# Patient Record
Sex: Female | Born: 1935 | Hispanic: No | Smoking: Never smoker
Health system: Southern US, Community
[De-identification: ages and names within clinical notes are randomized; demographics above are authoritative.]

## PROBLEM LIST (undated history)

## (undated) DIAGNOSIS — G709 Myoneural disorder, unspecified: Secondary | ICD-10-CM

## (undated) DIAGNOSIS — E119 Type 2 diabetes mellitus without complications: Secondary | ICD-10-CM

## (undated) DIAGNOSIS — T7840XA Allergy, unspecified, initial encounter: Secondary | ICD-10-CM

## (undated) DIAGNOSIS — I10 Essential (primary) hypertension: Secondary | ICD-10-CM

## (undated) DIAGNOSIS — E785 Hyperlipidemia, unspecified: Secondary | ICD-10-CM

## (undated) DIAGNOSIS — F419 Anxiety disorder, unspecified: Secondary | ICD-10-CM

## (undated) DIAGNOSIS — M1990 Unspecified osteoarthritis, unspecified site: Secondary | ICD-10-CM

## (undated) DIAGNOSIS — R0989 Other specified symptoms and signs involving the circulatory and respiratory systems: Secondary | ICD-10-CM

## (undated) DIAGNOSIS — R079 Chest pain, unspecified: Secondary | ICD-10-CM

## (undated) HISTORY — DX: Hyperlipidemia, unspecified: E78.5

## (undated) HISTORY — DX: Myoneural disorder, unspecified: G70.9

## (undated) HISTORY — DX: Allergy, unspecified, initial encounter: T78.40XA

## (undated) HISTORY — DX: Essential (primary) hypertension: I10

## (undated) HISTORY — DX: Type 2 diabetes mellitus without complications: E11.9

## (undated) HISTORY — DX: Anxiety disorder, unspecified: F41.9

## (undated) HISTORY — DX: Unspecified osteoarthritis, unspecified site: M19.90

## (undated) HISTORY — PX: EYE SURGERY: SHX253

---

## 1996-07-27 HISTORY — PX: VEIN BYPASS SURGERY: SHX833

## 2009-07-27 HISTORY — PX: OTHER SURGICAL HISTORY: SHX169

## 2012-10-27 NOTE — Telephone Encounter (Signed)
L/m confirming appt for 11/01/12

## 2012-11-01 NOTE — Progress Notes (Signed)
Pt here for office visit, no new complaints at this time.

## 2012-11-01 NOTE — Progress Notes (Signed)
The patient is in office for follow-up visit.    This is a 79 year old female with history of coronary artery disease.  The patient was first evaluated in 1994 following a non-ST elevation myocardial infarction.   Workup at that time including catheterization  showed significant LAD disease and moderate disease of the left circumflex artery.  The LAD disease was treated with coronary stent.  However,  The patient presented again with unstable angina 2 months later.  Repeat catheterization show in-stent restenosis of the LAD and moderate left circumflex artery disease.  Subsequent, she was referred and underwent successful coronary artery bypass surgery.  Since then, the patient has been followed regularly in the office.  She remained stable and asymptomatic.  Echocardiogram from 2012 showed normal left ventricle systolic function with mild to moderate aortic regurgitation.    During her visit in the office in February of this year, she was noted to have a right carotid bruit.  Subsequent, carotid ultrasound done showed 60-80% right internal carotid stenosis. She was referred to Dr. Henrene Pastor for evaluation.  Apparently, no intervention was indicated and the patient is to be treated medically and observe.    In the office, she denies shortness of breath, chest discomfort, dyspnea on exertion, PND or orthopnea.   She also denied dizziness or TIA-like symptoms.    Physical examination showed heart rate 64 and regular, blood pressure 140/60.  Neck: Supple, no JVD, there is a soft right carotid bruit noted. Lungs: Clear to auscultation.  Heart: Normal S1, normal S2, 2/6 systolic ejection murmur at the left sternal border, no change from before. Abdomen: Soft, nontender, no abdominal bruit noted.  Extremities: No peripheral edema.    Impression: 1 coronary artery disease, status post coronary bypass surgery,  The patient is asymptomatic.  2  Right carotid  Artery stenosis, asymptomatic.3 diabetes.     Recommendation: 1 continue patient on her current medical therapy and clinical observation.    Follow-up in 3 months.

## 2012-11-01 NOTE — Patient Instructions (Signed)
MyChart Activation    Thank you for requesting access to MyChart. Please follow the instructions below to securely access and download your online medical record. MyChart allows you to send messages to your doctor, view your test results, renew your prescriptions, schedule appointments, and more.    How Do I Sign Up?    1. In your internet browser, go to [email or web address]  2. Click on the First Time User? Click Here link in the Sign In box. You will be redirect to the New Member Sign Up page.  3. Enter your MyChart Access Code exactly as it appears below. You will not need to use this code after you???ve completed the sign-up process. If you do not sign up before the expiration date, you must request a new code.    MyChart Access Code: [email or web address]  Expires: 01/30/2013  5:23 PM (This is the date your MyChart access code will expire)    4. Enter the last four digits of your Social Security Number (xxxx) and Date of Birth (mm/dd/yyyy) as indicated and click Submit. You will be taken to the next sign-up page.  5. Create a MyChart ID. This will be your MyChart login ID and cannot be changed, so think of one that is secure and easy to remember.  6. Create a MyChart password. You can change your password at any time.  7. Enter your Password Reset Question and Answer. This can be used at a later time if you forget your password.   8. Enter your e-mail address. You will receive e-mail notification when new information is available in MyChart.  9. Click Sign Up. You can now view and download portions of your medical record.  10. Click the Download Summary menu link to download a portable copy of your medical information.    Additional Information    If you have questions, please visit the Frequently Asked Questions section of the MyChart website at [email or web address] Remember, MyChart is NOT to be used for urgent needs. For medical emergencies, dial 911.

## 2013-01-26 NOTE — Telephone Encounter (Signed)
Confirmed 01/31/13 appt

## 2013-04-13 NOTE — Progress Notes (Addendum)
The patient is in office for follow-up visit.    Since her last visit, the patient denied chest pain, palpitation or dyspnea on exertion.  However, she does complain of occasional dizziness.    Physical examination showed heart rate 64 and regular, blood pressure 112/58.  Patient weights 120 pounds..  Patient is awake and oriented ??3, in no acute distress..  Neck: Supple, no JVD, soft right carotid bruit noted.  Lungs: Clear to auscultation.  Heart: Normal S1, normal S2, 2/6 systolic ejection murmur at the left sternal border. Abdomen: Soft, nontender, no abdominal bruit noted.  Extremities: No peripheral edema, full range of motion, steady gait.  Neurological: Grossly   intact.    Twelve-lead ECG showed normal sinus rhythm, nonspecific ST-T wave changes.    Review of laboratory examination dated July show total cholesterol 120, HDL 50, LDL 53 and triglycerides of 87.  Liver function tests was within normal limits.    Carotid ultrasound from Dr. Arlyss Gandy office in August showed 70-79% stenosis of the right internal carotid artery.    Impression: 1 coronary artery disease, status post coronary artery bypass surgery.  The patient is hemodynamically stable and asymptomatic.  2 carotid artery disease, asymptomatic.  3 borderline hypotension with dizziness, possibly due to medication.  4 diabetes.  5 hyperlipidemia, excellent lipid profile.    Recommendation: Decrease amlodipine to 2.5 mg daily, continue patient on her other medications.    Follow-up in one month.

## 2013-04-13 NOTE — Patient Instructions (Signed)
MyChart Activation    Thank you for requesting access to MyChart. Please follow the instructions below to securely access and download your online medical record. MyChart allows you to send messages to your doctor, view your test results, renew your prescriptions, schedule appointments, and more.    How Do I Sign Up?    1. In your internet browser, go to [email or web address]  2. Click on the First Time User? Click Here link in the Sign In box. You will be redirect to the New Member Sign Up page.  3. Enter your MyChart Access Code exactly as it appears below. You will not need to use this code after you???ve completed the sign-up process. If you do not sign up before the expiration date, you must request a new code.    MyChart Access Code: [email or web address]  Expires: 07/12/2013  9:21 AM (This is the date your MyChart access code will expire)    4. Enter the last four digits of your Social Security Number (xxxx) and Date of Birth (mm/dd/yyyy) as indicated and click Submit. You will be taken to the next sign-up page.  5. Create a MyChart ID. This will be your MyChart login ID and cannot be changed, so think of one that is secure and easy to remember.  6. Create a MyChart password. You can change your password at any time.  7. Enter your Password Reset Question and Answer. This can be used at a later time if you forget your password.   8. Enter your e-mail address. You will receive e-mail notification when new information is available in MyChart.  9. Click Sign Up. You can now view and download portions of your medical record.  10. Click the Download Summary menu link to download a portable copy of your medical information.    Additional Information    If you have questions, please visit the Frequently Asked Questions section of the MyChart website at [email or web address] Remember, MyChart is NOT to be used for urgent needs. For medical emergencies, dial 911.

## 2013-05-04 NOTE — Progress Notes (Signed)
The patient is in office for follow-up visit.    Since the patient's last visit, she states she has been feeling better.  According to her daughter, the patient seems to be a little more energetic.  She denied dizziness, palpitation, chest discomfort or shortness of breath.    Physical examination showed heart rate 61 and regular, blood pressure 124/68  Patient weights 118 pounds..  Patient is awake and oriented ??3, in no acute distress..  Neck: Supple, no JVD or bruit.  Lungs: Clear to auscultation.  Heart: Normal S1, normal S2, 2/6 systolic ejection murmur at the left sternal border.  Abdomen: Soft, nontender, no abdominal bruit noted.  Extremities: No peripheral edema, full range of motion, steady gait.  Neurological: Grossly intact.    Impression: 1 coronary artery disease, status post coronary bypass surgery.  The patient is asymptomatic.  2 carotid artery disease, asymptomatic.  3 borderline hypotension, blood pressure improved with decreased dose of amlodipine.  4 history of diabetes.  5 history of hyperlipidemia.    Recommendation:  Continue patient on her current medical therapy given the improved condition.    Follow-up in 3 months.     The patient was counseled on the dangers of tobacco use.     I attest that current meds have been reviewed and are accurate.

## 2013-09-07 NOTE — Patient Instructions (Signed)
MyChart Activation    Thank you for requesting access to MyChart. Please follow the instructions below to securely access and download your online medical record. MyChart allows you to send messages to your doctor, view your test results, renew your prescriptions, schedule appointments, and more.    How Do I Sign Up?    1. In your internet browser, go to [email or web address].  2. Click on the First Time User? Click Here link in the Sign In box. You will see the New Member Sign Up page.  3. Enter your MyChart Access Code exactly as it appears below. You will not need to use this code after you???ve completed the sign-up process. If you do not sign up before the expiration date, you must request a new code.    MyChart Access Code: [email or web address]  Expires: 12/06/2013  9:11 AM (This is the date your MyChart access code will expire)    4. Enter the last four digits of your Social Security Number (xxxx) and Date of Birth (mm/dd/yyyy) as indicated and click Submit. You will be taken to the next sign-up page.  5. Create a MyChart ID. This will be your MyChart login ID and cannot be changed, so think of one that is secure and easy to remember.  6. Create a MyChart password. You can change your password at any time.  7. Enter your Password Reset Question and Answer. This can be used at a later time if you forget your password.   8. Enter your e-mail address. You will receive e-mail notification when new information is available in MyChart.  9. Click Sign Up. You can now view and download portions of your medical record.  10. Click the Download Summary menu link to download a portable copy of your medical information.    Additional Information    If you have questions, please visit the Frequently Asked Questions section of the MyChart website at [email or web address] Remember, MyChart is NOT to be used for urgent needs. For medical emergencies, dial 911.

## 2013-09-07 NOTE — Progress Notes (Signed)
The patient is in office for follow-up visit.    Since the patient's last visit, she denied chest discomfort, shortness of breath or dyspnea on exertion.  She also stated dizziness has improved after decrease of the medication.    Physical examination showed heart rate 72 and regular, blood pressure 122/50.  Patient weights 120 pounds  Patient is awake and oriented ??3, in no acute distress..  Neck: Supple, no JVD, soft right carotid bruit.  Lungs: Clear to auscultation.  Heart: Normal S1, normal S2, 2/6 systolic ejection murmur at left sternal border.  Abdomen: Soft, nontender, no abdominal bruit noted.  Extremities: No peripheral edema, full range of motion, steady gait.  Neurological: Grossly intact.    Impression: 1 coronary artery disease, status post coronary bypass surgery.  The patient is angina free.  2 carotid artery disease, 70-79% by ultrasound, the patient is asymptomatic.  3 history of hypertension, blood pressure control.  For diabetes.  5 history of hyperlipidemia.    Recommendation: Continue patient on her current medical therapy.    Follow-up in 3 months.    Discussed the patient's above normal BMI with the patient.  I have recommended the following interventions: dietary management education, guidance, and counseling  encourage exercise .      The patient was counseled on the dangers of tobacco use.     I attest that current meds have been reviewed and are accurate.

## 2013-12-07 NOTE — Patient Instructions (Signed)
MyChart Activation    Thank you for requesting access to MyChart. Please follow the instructions below to securely access and download your online medical record. MyChart allows you to send messages to your doctor, view your test results, renew your prescriptions, schedule appointments, and more.    How Do I Sign Up?    1. In your internet browser, go to [email or web address].  2. Click on the First Time User? Click Here link in the Sign In box. You will see the New Member Sign Up page.  3. Enter your MyChart Access Code exactly as it appears below. You will not need to use this code after you???ve completed the sign-up process. If you do not sign up before the expiration date, you must request a new code.    MyChart Access Code: [email or web address]  Expires: 03/07/2014  9:20 AM (This is the date your MyChart access code will expire)    4. Enter the last four digits of your Social Security Number (xxxx) and Date of Birth (mm/dd/yyyy) as indicated and click Submit. You will be taken to the next sign-up page.  5. Create a MyChart ID. This will be your MyChart login ID and cannot be changed, so think of one that is secure and easy to remember.  6. Create a MyChart password. You can change your password at any time.  7. Enter your Password Reset Question and Answer. This can be used at a later time if you forget your password.   8. Enter your e-mail address. You will receive e-mail notification when new information is available in MyChart.  9. Click Sign Up. You can now view and download portions of your medical record.  10. Click the Download Summary menu link to download a portable copy of your medical information.    Additional Information    If you have questions, please visit the Frequently Asked Questions section of the MyChart website at [email or web address] Remember, MyChart is NOT to be used for urgent needs. For medical emergencies, dial 911.

## 2013-12-07 NOTE — Progress Notes (Signed)
The patient is in office for follow-up visit.    Since her last visit, the patient has been feeling well.  She denied chest discomfort, shortness of breath, dyspnea on exertion, PND or orthopnea.    Physical examination showed heart rate 68 and regular, blood pressure 150/72.  Patient weights 119 pounds..  Patient is awake and oriented ??3, in no acute distress..  Neck: Supple, no JVD, right carotid bruit.  Lungs: Clear to auscultation.  Heart: Normal S1, normal S2, 2/6 systolic ejection murmur at the left sternal border. Abdomen: Soft, nontender, no abdominal bruit noted.  Extremities: No peripheral edema, full range of motion, steady gait.  Neurological: Grossly intact.    Twelve-lead ECG showed normal sinus rhythm, within normal limits.    Impression: 1 coronary disease, status post coronary bypass surgery.  The patient is stable.  2 carotid artery disease, asymptomatic.  3 hypertension.  4 diabetes.  5 hyperlipidemia.    Recommendation: Continue patient on medical therapy.    Follow-up in 3 months.      Discussed the patient's above normal BMI with the patient.  I have recommended the following interventions: dietary management education, guidance, and counseling  encourage exercise .      The patient was counseled on the dangers of tobacco use.     I attest that current meds have been reviewed and are accurate.

## 2014-05-22 ENCOUNTER — Ambulatory Visit
Admit: 2014-05-22 | Discharge: 2014-05-22 | Payer: MEDICARE | Attending: Interventional Cardiology | Primary: Internal Medicine

## 2014-05-22 DIAGNOSIS — I2510 Atherosclerotic heart disease of native coronary artery without angina pectoris: Secondary | ICD-10-CM

## 2014-05-22 NOTE — Progress Notes (Signed)
The patient is in office for follow-up visit.    Since the patient's last visit, she states she has experienced a few episodes of chest discomfort.  She she described one particular episode as crushing chest discomfort, nonradiating, not associated shows of breath with diaphoresis.  The discomfort is not related.  The other episodes were described as pinching sensation. The patient [place].  The EKG showed no significant changes. She stated that she increase the Imdur from 30 mg to 45 mg daily.    Physical examination showed heart rate 72 and regular, blood pressure 140/70.  Patient weighs 120 pounds..  Patient is awake and oriented ??3, in no acute distress..  Neck: Supple, no JVD, right carotid bruit.Lungs: Clear to auscultation.  Heart: Normal S1, normal S2, 2/6 systolic ejection murmur at left sternal border. Abdomen: Soft, nontender, no abdominal bruit noted.  Extremities: No peripheral edema, full range of motion, steady gait.  Neurological: Grossly intact.    Twelve-lead ECG showed normal sinus rhythm, nonspecific ST-T wave changes.    Impression: 1 coronary artery disease, status post coronary artery bypass surgery.  2 chest pain of undetermined etiology, rule out angina.  3 hypertension.  4 diabetes.  5 hyperlipidemia.  6 carotid disease, asymptomatic.    Recommendation: Outpatient pharmacology stress Myoview test to rule out graft closure.  The patient is to continue her current medications.    Discussed the patient's above normal BMI with the patient.  I have recommended the following interventions: dietary management education, guidance, and counseling  encourage exercise .      The patient was counseled on the dangers of tobacco use.     I attest that current meds have been reviewed and are accurate.

## 2014-05-23 NOTE — Telephone Encounter (Addendum)
patients daughter called today and said that they would like to proceed with the cath . No stress test. I will set up cath for 06/06/2014 .cs    Discussed with pt daughter- agree with stress test, will proceed  A.Kizzie IdeShih

## 2014-05-24 ENCOUNTER — Encounter

## 2014-05-30 ENCOUNTER — Inpatient Hospital Stay: Admit: 2014-05-30 | Payer: MEDICARE | Attending: Interventional Cardiology | Primary: Internal Medicine

## 2014-05-30 DIAGNOSIS — R0789 Other chest pain: Secondary | ICD-10-CM

## 2014-05-30 DIAGNOSIS — R079 Chest pain, unspecified: Secondary | ICD-10-CM

## 2014-05-30 MED ORDER — TECHNETIUM TC 99M SESTAMIBI - CARDIOLITE
Freq: Once | Status: AC
Start: 2014-05-30 — End: 2014-05-30
  Administered 2014-05-30: 16:00:00 via INTRAVENOUS

## 2014-05-30 MED ORDER — REGADENOSON 0.4 MG/5 ML IV SYRINGE
0.4 mg/5 mL | INTRAVENOUS | Status: AC
Start: 2014-05-30 — End: 2014-05-30
  Administered 2014-05-30: 16:00:00 via INTRAVENOUS

## 2014-05-30 MED ORDER — TECHNETIUM TC 99M SESTAMIBI - CARDIOLITE
Freq: Once | Status: AC
Start: 2014-05-30 — End: 2014-05-30
  Administered 2014-05-30: 17:00:00 via INTRAVENOUS

## 2014-05-30 MED FILL — LEXISCAN 0.4 MG/5 ML INTRAVENOUS SYRINGE: 0.4 mg/5 mL | INTRAVENOUS | Qty: 5

## 2014-05-30 MED FILL — TECHNETIUM TC 99M SESTAMIBI - CARDIOLITE: Qty: 11

## 2014-05-30 MED FILL — TECHNETIUM TC 99M SESTAMIBI - CARDIOLITE: Qty: 31

## 2014-05-30 NOTE — Procedures (Signed)
[facility]  STRESS TEST                                            Name: Anna Hardy, Anna Hardy                                            MR#: [id number]                                            Account #: [id number]                                            Date of Adm: 05/30/2014        Facility [facility]  DocId [id number]  ChartScript-[id number]    ADENOSINE MYOVIEW REPORT    HISTORY: A 80-year-old female with history of diabetes,  coronary artery disease, status post coronary artery bypass surgery  in the remote past, is now referred for a stress test which was done  for recurrent chest discomfort.    PHYSICAL EXAMINATION  VITAL SIGNS: Heart rate 83 and regular, blood pressure 185/66.  CARDIOVASCULAR: Exam was unremarkable.    EKG: A 12-lead EKG showed normal sinus rhythm, within normal  limits.    STRESS TEST: The patient underwent adenosine Myoview as per  protocol. There was no chest discomfort or shortness of breath  reported with adenosine. A 12-lead EKG with adenosine showed  0.5 to 1 mm downsloping ST depression in inferolateral leads. The  test is interpreted as positive stress test by EKG criteria. Nuclear  imaging pending.    Further recommendations pending results of nuclear imaging.        Jaleena Viviani CHIH MD    AS / CR  D:  05/30/2014   11:38  T:  05/30/2014   12:33  Job #:  [id number]              wm    CS Doc#:  [id number]                                    Page 1 of 2

## 2014-05-30 NOTE — Progress Notes (Signed)
Pt tolerated procedure well; NAD noted; VS as charted; pt without complaints; Dr Kizzie IdeShih in attendance; pt awaiting further scans with family, in stable condition.

## 2014-05-30 NOTE — Procedures (Signed)
[facility]                                            Name: Anna Hardy, Anna Hardy                                            MR#: [id number]                                            Account #: [id number]                                            Date of Adm: 05/30/2014        Facility [facility]  DocId [id number]  ChartScript-[id number]    ADENOSINE MYOVIEW REPORT    HISTORY: A 80 year old female with history of diabetes,  coronary artery disease, status post coronary artery bypass surgery  in the remote past, is now referred for a stress test which was done  for recurrent chest discomfort.    PHYSICAL EXAMINATION  VITAL SIGNS: Heart rate 83 and regular, blood pressure 185/66.  CARDIOVASCULAR: Exam was unremarkable.    EKG: A 12-lead EKG showed normal sinus rhythm, within normal  limits.    STRESS TEST: The patient underwent adenosine Myoview as per  protocol. There was no chest discomfort or shortness of breath  reported with adenosine. A 12-lead EKG with adenosine showed  0.5 to 1 mm downsloping ST depression in inferolateral leads. The  test is interpreted as positive stress test by EKG criteria. Nuclear  imaging pending.    Further recommendations pending results of nuclear imaging.        Ulyess MortSHIH, Shivank Pinedo [facility] MD    AS / [place]  D:  05/30/2014   11:38  T:  05/30/2014   12:33  Job #:  [id number]              wm    CS Doc#:  [id number]                                    Page 1 of 2

## 2014-07-12 ENCOUNTER — Encounter: Payer: MEDICARE | Attending: Interventional Cardiology | Primary: Internal Medicine

## 2014-07-12 ENCOUNTER — Encounter: Attending: Interventional Cardiology | Primary: Internal Medicine

## 2014-07-17 ENCOUNTER — Ambulatory Visit
Admit: 2014-07-17 | Discharge: 2014-07-17 | Payer: MEDICARE | Attending: Interventional Cardiology | Primary: Internal Medicine

## 2014-07-17 DIAGNOSIS — I2510 Atherosclerotic heart disease of native coronary artery without angina pectoris: Secondary | ICD-10-CM

## 2014-07-17 MED ORDER — VALSARTAN-HYDROCHLOROTHIAZIDE 320 MG-25 MG TAB
320-25 mg | ORAL_TABLET | Freq: Every day | ORAL | Status: DC
Start: 2014-07-17 — End: 2015-01-10

## 2014-07-17 NOTE — Progress Notes (Signed)
The patient is in office for follow-up visit.    Since the patient's last visit, she underwent an outpatient stress Myoview test.  The Myoview scan showed no evidence of ischemia with ejection fraction of 75%.  In addition, the patient was evaluated in urgent care center for chest discomfort.  She was noted her blood pressure was elevated.    In the office, the patient denied recurrent chest discomfort, shortness of breath or dyspnea on exertion.    Physical examination showed heart rate 68 and regular, blood pressure 158/64 initially with repeat 140/60.  Patient weights 121 pounds.  Patient is awake and oriented ??3, in no acute distress..  Neck: Supple, no JVD, right carotid bruit.  Lungs: Clear to auscultation.  Heart: Normal S1, normal S2, 2/6 systolic ejection murmur at the left sternal border..  Abdomen: Soft, nontender, no abdominal bruit noted.  Extremities: No peripheral edema, full range of motion, steady gait.  Neurological: Grossly intact.    Impression: 1 coronary artery disease, status post coronary bypass surgery.  2 chest pain, no recurrence, negative stress Myoview test recently.  3 diabetes.  4 hypertension.  5 hyperlipidemia.  6 right carotid bruit.    Recommendation: Change Diovan HCT from 320/12.5 mg daily to 320/25 mg daily for better blood pressure control.  The patient is to continue on her other medications.    Follow-up in one month.      Discussed the patient's above normal BMI with the patient.  I have recommended the following interventions: dietary management education, guidance, and counseling  encourage exercise .      The patient was counseled on the dangers of tobacco use.     I attest that current meds have been reviewed and are accurate.

## 2014-08-16 ENCOUNTER — Ambulatory Visit
Admit: 2014-08-16 | Discharge: 2014-08-16 | Payer: MEDICARE | Attending: Interventional Cardiology | Primary: Internal Medicine

## 2014-08-16 DIAGNOSIS — I2510 Atherosclerotic heart disease of native coronary artery without angina pectoris: Secondary | ICD-10-CM

## 2014-08-16 NOTE — Progress Notes (Signed)
The patient is in office for follow-up visit.    Since the patient's last visit, she has been on Diovan HCT 320/25 mg daily.  She denies dizziness, shortness of breath, chest discomfort or syncope.    Physical examination showed heart rate 54 and regular, blood pressure 152/58 initially with repeat 144/60.  Patient weighs 121 pounds which is unchanged from before..  Patient is awake and oriented ??3, in no acute distress..  Neck: Supple, no JVD, right carotid bruit  Lungs: Clear to auscultation.  Heart: Normal S1, normal S2, 2/6 systolic ejection murmur at left sternal border..  Abdomen: Soft, nontender, no abdominal bruit noted.  Extremities: No peripheral edema, full range of motion, steady gait.  Neurological: Grossly intact.    Impression: 1 coronary artery disease, status post coronary artery bypass surgery the patient is asymptomatic.  2  Diabetes.  3 hypertension.  4 hyperlipidemia.  5 right carotid bruit.    Recommendation: Continue patient on her current medical therapy.    Follow-up in 3 months.    Discussed the patient's above normal BMI with the patient.  I have recommended the following interventions: dietary management education, guidance, and counseling  encourage exercise .      The patient was counseled on the dangers of tobacco use.     I attest that current meds have been reviewed and are accurate.

## 2014-11-13 ENCOUNTER — Ambulatory Visit
Admit: 2014-11-13 | Discharge: 2014-11-13 | Payer: MEDICARE | Attending: Interventional Cardiology | Primary: Internal Medicine

## 2014-11-13 DIAGNOSIS — I2510 Atherosclerotic heart disease of native coronary artery without angina pectoris: Secondary | ICD-10-CM

## 2014-11-13 NOTE — Progress Notes (Signed)
The patient is in office for follow-up visit.    Since the patient's last visit, she had developed symptom of jaw discomfort with brisk walk.  She denied shows of breath or diaphoresis with  jaw discomfort. The discomfort would resolve at rest.  She denied chest discomfort or jaw pain at rest.    Physical examination showed heart rate 70 and regular, blood pressure 130/68 with repeat 140/60.  Patient weights 122 pounds..  Patient is awake and oriented ??3, in no acute distress..  Neck: Supple, no JVD, bilateral carotidbruit.  Lungs: Clear to auscultation.  Heart: Normal S1, normal S2, 2/6 systolic ejection murmur at left sternal border.  Abdomen: Soft, nontender, no abdominal bruit noted.  Extremities: No peripheral edema, full range of motion, steady gait.  Neurological: Grossly intact.    Twelve-lead ECG showed normal sinus rhythm, within normal limits.    Impression: 1 coronary artery disease, status post coronary artery bypass surgery in the past.  2.  Recent onset jaw discomfort with physical activity, possible exertional angina.  The patient had a negative stress nuclear study in November 2015.  3.  Diabetes.  4.  Carotid artery disease.  5.  Hyperlipidemia.  6.  Hypertension.    Recommendation:  Increase isosorbide mononitrate to 60 mg daily.  The patient should continue her other medications.  echocardiogram to assess LV function and valvular pathology.    Follow-up in one month.    Discussed the patient's above normal BMI with the patient.  I have recommended the following interventions: dietary management education, guidance, and counseling  encourage exercise .      The patient was counseled on the dangers of tobacco use.     I attest that current meds have been reviewed and are accurate.

## 2014-12-05 ENCOUNTER — Encounter: Payer: MEDICARE | Primary: Internal Medicine

## 2014-12-10 ENCOUNTER — Ambulatory Visit
Admit: 2014-12-10 | Discharge: 2014-12-10 | Payer: MEDICARE | Attending: Interventional Cardiology | Primary: Internal Medicine

## 2014-12-10 DIAGNOSIS — I2510 Atherosclerotic heart disease of native coronary artery without angina pectoris: Secondary | ICD-10-CM

## 2014-12-10 NOTE — Progress Notes (Signed)
The patient is in office for follow-up visit.    Since the patient's last visit, she had an outpatient echocardiogram.  The echo showed normal left systolic function with mild aortic and mitral regurgitation.    In the office, the patient states she feels better.  She denied recurrent jaw pain with activities.  She is only taking isosorbide mononitrate 45 mg daily.    Physical examination showed heart rate 82 and regular, blood pressure 118/50.  Patient weights 124 pounds..  Patient is awake and oriented ??3, in no acute distress..  Neck: Supple, no JVD, bilateral carotid bruit  Lungs: Clear to auscultation.  Heart: Normal S1, normal S2, 2/6 systolic ejection murmur at upper left sternal border.  Abdomen: Soft, nontender, no abdominal bruit noted.  Extremities: No peripheral edema, full range of motion, steady gait.  Neurological: Grossly intact.    Impression: 1 coronary artery disease, status post coronary artery bypass surgery.  Negative stress Myoview test in November 2015.  2.  Chest discomfort, possible exertional angina.  Symptom improved with higher dose of oral nitrate.  3.  Diabetes.  4.  History of carotid artery disease.  5.  Hyperlipidemia.  6.  Hypertension.    Recommendation: Continue patient on her current medical therapy. Carotid ultrasound.    I discussed the result of the echocardiogram with the patient and her daughter.    Follow-up in 3 months.      Discussed the patient's above normal BMI with the patient.  I have recommended the following interventions: dietary management education, guidance, and counseling  encourage exercise .      The patient was counseled on the dangers of tobacco use.     I attest that current meds have been reviewed and are accurate.

## 2014-12-11 ENCOUNTER — Encounter: Attending: Interventional Cardiology | Primary: Internal Medicine

## 2015-01-10 MED ORDER — VALSARTAN-HYDROCHLOROTHIAZIDE 320 MG-25 MG TAB
320-25 mg | ORAL_TABLET | ORAL | Status: DC
Start: 2015-01-10 — End: 2017-03-11

## 2015-01-16 ENCOUNTER — Encounter: Primary: Internal Medicine

## 2015-02-06 ENCOUNTER — Encounter: Payer: MEDICARE | Primary: Internal Medicine

## 2015-03-11 ENCOUNTER — Encounter: Attending: Interventional Cardiology | Primary: Internal Medicine

## 2015-03-18 ENCOUNTER — Ambulatory Visit
Admit: 2015-03-18 | Discharge: 2015-03-18 | Payer: MEDICARE | Attending: Interventional Cardiology | Primary: Internal Medicine

## 2015-03-18 DIAGNOSIS — I2510 Atherosclerotic heart disease of native coronary artery without angina pectoris: Secondary | ICD-10-CM

## 2015-03-18 NOTE — Progress Notes (Signed)
The patient is in office for follow-up visit.    Since her last visit, the patient had a carotid ultrasound.  The ultrasound showed moderate disease of common carotid arteries bilaterally approximately 50-69%.    In the office, the patient denied palpitation, dizziness, shortness of breath or chest discomfort.  She is active physically and is compliant with her medications.    Physical examination showed heart rate 60 and regular, blood pressure 148/66 with repeat 140/60.  Patient weights 125 pounds.  Patient is awake and oriented ??3, in no acute distress..  Neck: Supple, no JVD, bilateral carotid bruit.  Lungs: Clear to auscultation.  Heart: Normal S1, normal S2, 2/6 systolic ejection murmur at the left sternal border..  Abdomen: Soft, nontender, no abdominal bruit noted.  Extremities: No peripheral edema, full range of motion, steady gait.  Neurological: Grossly intact.    Impression: 1 coronary disease, status post coronary bypass surgery.  The patient is stable.  2 bilateral carotid artery disease, moderate by ultrasound.  3 diabetes.  4 history of hypertension.  5 history of hyperlipidemia.    Recommendation: Continue patient on her current medical therapy.    I discussed the result of the carotid ultrasound with the patient and her daughter.    Follow-up in 3 months.      Discussed the patient's above normal BMI with the patient.  I have recommended the following interventions: dietary management education, guidance, and counseling  encourage exercise .      The patient was counseled on the dangers of tobacco use.     I attest that current meds have been reviewed and are accurate.

## 2015-06-10 ENCOUNTER — Ambulatory Visit
Admit: 2015-06-10 | Discharge: 2015-06-10 | Payer: MEDICARE | Attending: Interventional Cardiology | Primary: Internal Medicine

## 2015-06-10 DIAGNOSIS — I2510 Atherosclerotic heart disease of native coronary artery without angina pectoris: Secondary | ICD-10-CM

## 2015-06-10 NOTE — Progress Notes (Signed)
The patient is in office for follow-up visit.    Since her last visit, the patient has done well.  She denied chest discomfort, shortness of breath, dyspnea on exertion, PND or orthopnea.  She also denied palpitation or dizziness.    Physical examination showed heart rate 73 regular, blood pressure 130/78.  Patient weighs 124 pounds.  Patient is awake and oriented ??3, in no acute distress..  Neck: Supple, no JVD, soft bilateral carotidbruit.  Lungs: Clear to auscultation.  Heart: Normal S1, normal S2, 2/6 systolic ejection murmur at left sternal border..  Abdomen: Soft, nontender, no abdominal bruit noted.  Extremities: No peripheral edema, full range of motion, steady gait.  Neurological: Grossly intact.    Twelve-lead ECG showed normal sinus rhythm, within normal limits.    Impression: 1 coronary artery disease, status post coronary bypass surgery.  Patient has remained stable and asymptomatic.  2 bilateral carotid disease, asymptomatic.  3 diabetes.  4 hypertension.  5 hyperlipidemia.    Recommendation: Continue patient on current  medications.    Follow-up in 3 months.        Discussed the patient's above normal BMI with the patient.  I have recommended the following interventions: dietary management education, guidance, and counseling  encourage exercise .      The patient was counseled on the dangers of tobacco use.     I attest that current meds have been reviewed and are accurate.

## 2015-09-09 ENCOUNTER — Ambulatory Visit
Admit: 2015-09-09 | Discharge: 2015-09-09 | Payer: MEDICARE | Attending: Interventional Cardiology | Primary: Internal Medicine

## 2015-09-09 DIAGNOSIS — I2510 Atherosclerotic heart disease of native coronary artery without angina pectoris: Secondary | ICD-10-CM

## 2015-09-09 NOTE — Progress Notes (Signed)
The patient is in office for follow-up visit.    Since the patient's last visit, she has been doing well.  She denied chest discomfort, shortness of breath, dyspnea on exertion, PND or orthopnea.  The patient is compliant with her medications.    Physical examination showed heart rate 72 and regular, blood pressure 132/62.  Patient weight 125 pounds..  Patient is awake and oriented ??3, in no acute distress..  Neck: Supple, no JVD bilateral carotid bruit.,.  Lungs: Clear to auscultation.  Heart: Normal S1, normal S2, 2/6 systolic ejection murmur at the left sternal border. Abdomen: Soft, nontender, no abdominal bruit noted.  Extremities: No peripheral edema, full range of motion, steady gait.  Neurological: Grossly intact.    Impression: 1 coronary artery disease, status post coronary bypass surgery.  2 bilateral carotid artery disease, asymptomatic.  3 diabetes.  4 hyperlipidemia.  5 hypertension.    Recommendation: Continue patient on her current medications.    Follow-up in 3 months.    Discussed the patient's above normal BMI with the patient.  I have recommended the following interventions: dietary management education, guidance, and counseling  encourage exercise .      The patient was counseled on the dangers of tobacco use.     I attest that current meds have been reviewed and are accurate.

## 2016-01-02 ENCOUNTER — Ambulatory Visit
Admit: 2016-01-02 | Discharge: 2016-01-02 | Payer: MEDICARE | Attending: Interventional Cardiology | Primary: Internal Medicine

## 2016-01-02 DIAGNOSIS — I2510 Atherosclerotic heart disease of native coronary artery without angina pectoris: Secondary | ICD-10-CM

## 2016-01-02 NOTE — Progress Notes (Signed)
The patient is in office for unscheduled visit because of hypotension and for history of coronary disease, hyperlipidemia and diabetes.    The patient stated that he was doing well until yesterday when she experienced an episode of headache.  Apparently, blood pressure was noted to be 80/40 by her family at that time.  She denied dizziness, syncope, diaphoresis or chest discomfort.  This happened shortly after she took isosorbide mononitrate 45 mg.  She ate high salt food afterwards with improvement of her symptoms.  On questioning, she denied recent symptom of chest discomfort, shortness of breath, dyspnea on exertion, PND or orthopnea.  She has remained physically active.    Physical examination showed heart rate 75 and regular, blood pressure 130/58.  Patient was 126 pounds..  Patient is awake and oriented ??3, in no acute distress..  Neck: Supple, no JVD, bilateral carotid bruit.  Lungs: Clear to auscultation.  Heart: Normal S1, normal S2, 2/6 systolic ejection murmur at the left sternal border..  Abdomen: Soft, nontender, no abdominal bruit noted.  Extremities: No peripheral edema, full range of motion, steady gait.  Neurological: Grossly intact.    Twelve-lead ECG showed normal sinus rhythm, within normal limit.    Impression: 1 transient hypotension, probably due to medication.  2 history of coronary disease, status post coronary bypass surgery.  3 bilateral carotid bruit, asymptomatic.  4 hypertension.  5 hyperlipidemia.  6 diabetes.    Recommendation: Decrease Imdur from 45 mg daily to 30 mg daily.  Continue patient on her other medications.    Follow-up in 6 weeks.    Discussed the patient's above normal BMI with the patient.  I have recommended the following interventions: dietary management education, guidance, and counseling  encourage exercise .      The patient was counseled on the dangers of tobacco use.     I attest that current meds have been reviewed and are accurate.

## 2016-01-06 ENCOUNTER — Encounter: Attending: Interventional Cardiology | Primary: Internal Medicine

## 2016-02-10 ENCOUNTER — Ambulatory Visit
Admit: 2016-02-10 | Discharge: 2016-02-10 | Payer: MEDICARE | Attending: Interventional Cardiology | Primary: Internal Medicine

## 2016-02-10 DIAGNOSIS — I2510 Atherosclerotic heart disease of native coronary artery without angina pectoris: Secondary | ICD-10-CM

## 2016-02-10 NOTE — Progress Notes (Signed)
The patient is in office for follow-up visit for history of coronary disease, episodic hypotension, bilateral carotid bruit and hyperlipidemia.    Since the patient's last visit, she experienced another episode of transient hypotension.  The patient fell in the bathroom and sustained a hairline fracture of the ankle.  The patient was seen by her primary care physician and amlodipine dose was decreased to 2.5 mg daily.  The patient denied chest discomfort with decreased dose of oral nitrate.    Physical examination showed heart rate 66 and regular, blood pressure 159/68 initially with repeat 134/60.  Patient was 126 pounds which is unchanged from before..  Patient is awake and oriented ??3, in no acute distress..  Neck: Supple, no JVD, bilateral carotid bruit.  Lungs: Clear to auscultation.  Heart: Normal S1, normal S2, 2/6 systolic ejection murmur at the left sternal border..  Abdomen: Soft, nontender, no abdominal bruit noted.  Extremities: No peripheral edema, full range of motion, steady gait.  Neurological: Grossly intact.    Impression: 1 transient hypotension, possibly due to dehydration superimposed on use of oral nitrate.  The patient's blood pressure improved with higher lower dose of medications.  2 history of coronary artery disease, status post coronary bypass surgery.  3 hypertension.  4 history of bilateral carotid bruit.  5 hyperlipidemia.  6 diabetes.    Recommendation: Continue patient on current medical therapy and clinical observation.    Follow-up in 2 months.    Discussed the patient's above normal BMI with the patient.  I have recommended the following interventions: dietary management education, guidance, and counseling  encourage exercise .      The patient was counseled on the dangers of tobacco use.     I attest that current meds have been reviewed and are accurate.

## 2016-04-20 ENCOUNTER — Encounter: Attending: Interventional Cardiology | Primary: Internal Medicine

## 2016-04-20 ENCOUNTER — Ambulatory Visit
Admit: 2016-04-20 | Discharge: 2016-04-20 | Payer: MEDICARE | Attending: Interventional Cardiology | Primary: Internal Medicine

## 2016-04-20 DIAGNOSIS — I2510 Atherosclerotic heart disease of native coronary artery without angina pectoris: Secondary | ICD-10-CM

## 2016-04-20 MED ORDER — METOPROLOL SUCCINATE SR 25 MG 24 HR TAB
25 mg | ORAL_TABLET | Freq: Every day | ORAL | 1 refills | Status: DC
Start: 2016-04-20 — End: 2016-06-01

## 2016-04-20 NOTE — Progress Notes (Signed)
The patient is in office for follow-up visit for coronary artery disease, hypertension, hyperlipidemia and carotid bruits.    Since the patient's last visit, the amlodipine dose was increased to 5 mg by her primary care physician because of elevated blood pressure.  She denied chest discomfort, shortness of breath, dyspnea on exertion, PND or orthopnea.    Physical examination showed heart rate 72 and regular, blood pressure 170/70.  Patient was 127 pounds..  Patient is awake and oriented ??3, in no acute distress..  Neck: Supple, no JVD, bilateral carotid bruit.  Lungs: Clear to auscultation.  Heart: Normal S1, normal S2, 2/6 systolic ejection murmur at the left sternal border..  Abdomen: Soft, nontender, no abdominal bruit noted.  Extremities: No peripheral edema, full range of motion, steady gait.  Neurological: Grossly intact.    Impression: 1 coronary artery disease, status post coronary bypass surgery.  The patient is asymptomatic.  2 hypertension.  3 bilateral carotid bruit.  4 hyperlipidemia.  5 diabetes.    Recommendation: Add Toprol-XL 25 mg daily.  Continue patient on her other medications.    Follow-up in 6 weeks.    Discussed the patient's above normal BMI with the patient.  I have recommended the following interventions: dietary management education, guidance, and counseling  encourage exercise .      The patient was counseled on the dangers of tobacco use.     I attest that current meds have been reviewed and are accurate.

## 2016-06-01 ENCOUNTER — Ambulatory Visit
Admit: 2016-06-01 | Discharge: 2016-06-01 | Payer: MEDICARE | Attending: Interventional Cardiology | Primary: Internal Medicine

## 2016-06-01 DIAGNOSIS — I2510 Atherosclerotic heart disease of native coronary artery without angina pectoris: Secondary | ICD-10-CM

## 2016-06-01 NOTE — Progress Notes (Signed)
The patient is in office for follow-up visit   For history of coronary artery disease, hypertension, hyperlipidemia.    Since the patient's last visit, she has experienced dizziness after taking Toprol-XL.  This has occurred after 3 doses and the patient has stopped taking Toprol.  She denied chest discomfort, shortness of breath, dyspnea on exertion or palpitation.    Physical examination showed heart rate 72 and regular, blood pressure 146/60.  Patient weight 128 pounds..  Patient is awake and oriented ??3, in no acute distress..  Neck: Supple, no JVD,  bilateral carotid bruit.  Lungs: Clear to auscultation.  Heart: Normal S1, normal S2, 2/6 systolic ejection murmur at the left sternal border and aortic area..  Abdomen: Soft, nontender, no abdominal bruit noted.  Extremities: No peripheral edema, full range of motion, steady gait.  Neurological: Grossly intact.    Twelve-lead ECG showed normal sinus rhythm, minor nonspecific ST-T wave changes.    Impression: 1 coronary artery disease, status post coronary bypass surgery.  Patient is angina free.  2 hypertension, blood pressure improved without additional medications.  3 bilateral carotid bruit.  4 hyperlipidemia.  5 history of diabetes.    Recommendation: Continue patient on her current medical therapy and clinical observation.    Follow-up in 3 months.    Discussed the patient's above normal BMI with the patient.  I have recommended the following interventions: dietary management education, guidance, and counseling  encourage exercise .      The patient was counseled on the dangers of tobacco use.     I attest that current meds have been reviewed and are accurate.

## 2016-08-31 ENCOUNTER — Ambulatory Visit
Admit: 2016-08-31 | Discharge: 2016-08-31 | Payer: MEDICARE | Attending: Interventional Cardiology | Primary: Internal Medicine

## 2016-08-31 DIAGNOSIS — I2510 Atherosclerotic heart disease of native coronary artery without angina pectoris: Secondary | ICD-10-CM

## 2016-08-31 NOTE — Progress Notes (Signed)
The patient is in office for follow-up visit for history of coronary disease, hypertension, diabetes and hyperlipidemia.    Since the patient's last visit, she denied chest discomfort, shortness of breath, dyspnea on exertion, PND or orthopnea.  She denied recurrent dizziness or syncope.    Physical examination showed heart rate 80 and regular, blood pressure 150/60.  Patient weighs 128 pounds which is unchanged from before.  Patient is awake and oriented ??3, in no acute distress..  Neck: Supple, no JVD, bilateral carotid bruit..  Lungs: Clear to auscultation.  Heart: Normal S1, normal S2, 2/6 systolic ejection murmur at aortic area and the left sternal border.  Abdomen: Soft, nontender, no abdominal bruit noted.  Extremities: No peripheral edema, full range of motion, steady gait.  Neurological: Grossly intact.    Impression: 1 coronary artery disease, status post coronary bypass surgery.  The patient is hemodynamically stable and angina free.  2 hypertension.  3 bilateral carotid bruit, asymptomatic.  4 hyperlipidemia.  5 history of diabetes.    Recommendation: Continue patient on her current medications.    Follow-up in 3 months.    Discussed the patient's above normal BMI with the patient.  I have recommended the following interventions: dietary management education, guidance, and counseling  encourage exercise .      The patient was counseled on the dangers of tobacco use.     I attest that current meds have been reviewed and are accurate.

## 2016-12-03 ENCOUNTER — Ambulatory Visit
Admit: 2016-12-03 | Discharge: 2016-12-03 | Payer: MEDICARE | Attending: Interventional Cardiology | Primary: Internal Medicine

## 2016-12-03 DIAGNOSIS — I2510 Atherosclerotic heart disease of native coronary artery without angina pectoris: Secondary | ICD-10-CM

## 2016-12-03 NOTE — Progress Notes (Signed)
The patient is in office for follow-up visit for coronary disease, hypertension, diabetes and hyperlipidemia.    Since the patient's last visit, she denied chest discomfort, shortness of breath at rest, dyspnea on exertion, PND or orthopnea.  The patient is compliant with her medications and has remained physically active.    Physical examination showed heart rate 60 and regular, blood pressure 150/66.  Patient weighs 126 pounds.  Patient is awake and oriented ??3, in no acute distress..  Neck: Supple, no JVD,  bilateral carotid bruit.  Lungs: Clear to auscultation.  Heart: Normal S1, normal S2,  2/6 systolic ejection murmur at upper left sternal border and aortic area..  Abdomen: Soft, nontender, no abdominal bruit noted.  Extremities: No peripheral edema, full range of motion, steady gait.  Neurological: Grossly intact.    Twelve-lead ECG showed normal sinus rhythm, within normal limits.    Impression: 1 coronary disease, status post coronary bypass surgery.  The patient is angina free and hemodynamically stable.  2 bilateral carotid bruit.  3 hypertension.  4 hyperlipidemia.  5 diabetes.    Recommendation: Continue patient with current medical therapy.  Carotid ultrasound to assess current status of carotid disease.    Follow-up in 3 months.    Discussed the patient's above normal BMI with the patient.  I have recommended the following interventions: dietary management education, guidance, and counseling  encourage exercise .      The patient was counseled on the dangers of tobacco use.     I attest that current meds have been reviewed and are accurate.

## 2017-02-03 ENCOUNTER — Encounter: Payer: MEDICARE | Primary: Internal Medicine

## 2017-03-11 ENCOUNTER — Ambulatory Visit
Admit: 2017-03-11 | Discharge: 2017-03-11 | Payer: MEDICARE | Attending: Interventional Cardiology | Primary: Internal Medicine

## 2017-03-11 DIAGNOSIS — I2510 Atherosclerotic heart disease of native coronary artery without angina pectoris: Secondary | ICD-10-CM

## 2017-03-11 MED ORDER — IRBESARTAN-HYDROCHLOROTHIAZIDE 300 MG-12.5 MG TAB
ORAL_TABLET | Freq: Every day | ORAL | 1 refills | Status: AC
Start: 2017-03-11 — End: ?

## 2017-03-11 NOTE — Progress Notes (Signed)
The patient is in office for follow-up visit for coronary disease, hypertension, diabetes and result of the carotid ultrasound.    Since the patient's last visit, she had a carotid ultrasound done.  The carotid ultrasound showed moderate disease of the left internal  carotid artery (50-69%), moderately severe disease of the right internal carotid artery (greater than 70%).    In the office, the patient denied blurred vision, headache, diplopia, upper or lower extremity weakness or difficulty speaking.  She denied chest discomfort, shortness of breath or dyspnea on exertion.    Physical examination showed heart rate 64 and regular, blood pressure 134/64.  Patient was 122 pounds which is a decrease of 4 pounds since her last visit..  Patient is awake and oriented ??3, in no acute distress..  Neck: Supple, no JVD, bilateral carotid bruit.  Lungs: Clear to auscultation.  Heart: Normal S1, normal S2, 2/6 systolic ejection murmur at upper left sternal border and aortic area.  Abdomen: Soft, nontender, no abdominal bruit noted.  Extremities: No peripheral edema, full range of motion, steady gait.  Neurological: Grossly intact.    Impression: 1 coronary disease, status post coronary bypass surgery.  2 bilateral carotid bruit.  3 hypertension.  4 history of hyperlipidemia.  5 diabetes.    Recommendation: Continue patient on her current medical therapy.  Change valsartan HCT 320/25 mg daily to Avapro HCT 300/12.5 mg daily due to recent poor diet recall.    Follow-up in 3 months.    I have discussed the results of the ultrasound with the patient and her daughter.    Discussed the patient's above normal BMI with the patient.  I have recommended the following interventions: dietary management education, guidance, and counseling  encourage exercise .      The patient was counseled on the dangers of tobacco use.     I attest that current meds have been reviewed and are accurate.

## 2017-06-21 ENCOUNTER — Ambulatory Visit
Admit: 2017-06-21 | Discharge: 2017-06-21 | Payer: MEDICARE | Attending: Interventional Cardiology | Primary: Internal Medicine

## 2017-06-21 DIAGNOSIS — I25119 Atherosclerotic heart disease of native coronary artery with unspecified angina pectoris: Secondary | ICD-10-CM

## 2017-06-21 NOTE — Progress Notes (Signed)
The patient is in office for follow-up visit for history of coronary disease, status post coronary bypass surgery, carotid artery disease, hypertension.    Since the patient's last visit, she denied chest discomfort, shortness of breath at rest, dyspnea on exertion, PND or orthopnea.  She also denied dizziness, diplopia, or blurred vision.  The patient is compliant with her medications and remained physically active.    Physical examination showed heart rate 70 and regular, blood pressure 140/60.  Patient weights 124 pounds which is an increase of 2 pounds since her last visit.  Patient is awake and oriented ??3, in no acute distress..  Neck: Supple, no JVD, bilateral carotid bruit.  Lungs: Clear to auscultation.  Heart: Normal S1, normal S2, 2/6 systolic ejection murmur upper left sternal border and aortic area.  Abdomen: Soft, nontender, no abdominal bruit noted.  Extremities: No peripheral edema, full range of motion, steady gait.  Neurological: Grossly intact.    Twelve-lead ECG showed normal sinus rhythm, within normal limit.    Impression: 1 coronary artery disease, status post coronary artery bypass surgery.  The patient has remained stable and asymptomatic.  2 bilateral carotid bruit.  3 hypertension, blood pressure controlled.  4 hyperlipidemia.  5 diabetes.    Recommendation: Continue patient on her current medications.    Follow-up in 3 months.    Discussed the patient's above normal BMI with the patient.  I have recommended the following interventions: dietary management education, guidance, and counseling  encourage exercise .      The patient was counseled on the dangers of tobacco use.     I attest that current meds have been reviewed and are accurate.

## 2017-08-30 NOTE — Telephone Encounter (Addendum)
Patient received notice that their irbesartan-hydrochlorothiazide has been recalled.    Can you please prescribe a replacement medication?    Thank you  Anna Hardy    Please call back- tell her to discuss with her pharmacist for replacement for the same medication from different manufacturer.    A Shih    Done.  Anna Hardy

## 2017-10-07 ENCOUNTER — Ambulatory Visit
Admit: 2017-10-07 | Discharge: 2017-10-07 | Payer: MEDICARE | Attending: Interventional Cardiology | Primary: Internal Medicine

## 2017-10-07 DIAGNOSIS — I2510 Atherosclerotic heart disease of native coronary artery without angina pectoris: Secondary | ICD-10-CM

## 2017-10-07 NOTE — Progress Notes (Signed)
The patient is in office for follow-up visit for coronary disease, status post coronary bypass surgery, hypertension, hyperlipidemia and diabetes.    Since the patient's last visit, she denied chest discomfort, shortness of breath at rest, dyspnea on exertion, PND or orthopnea.  She also denied dizziness, palpitation.  The patient is compliant with her medications.  Her physical activity is rather limited due to degenerative joint disease of the knees.  However, the patient stated that she try to remain physically active.  The patient stated that she also takes amlodipine 2.5 mg in the morning in addition to 5 mg in the evening because her blood pressure elevated in the morning.    Physical examination showed heart rate 82 and regular, blood pressure 132/56.  Patient weight 123 pounds which is a decrease of 1 pound since her last visit.  Patient is awake and oriented ??3, in no acute distress..  Neck: Supple, no JVD, bilateral carotid bruit.  Lungs: Clear to auscultation.  Heart: Normal S1, normal S2, 2/6 systolic ejection murmur at upper left sternal border and aortic area..  Abdomen: Soft, nontender, no abdominal bruit noted.  Extremities: No peripheral edema, full range of motion, steady gait.  Neurological: Grossly intact.    Impression: 1 history of coronary disease, status post coronary bypass surgery.  The patient is asymptomatic.  2 hypertension.  3 diabetes.  4 hyperlipidemia.  5 bilateral carotid bruit.    Recommendation: Continue patient on her current medications.  Echocardiography for LV function.    Follow-up in 3 months.    Discussed the patient's above normal BMI with the patient.  I have recommended the following interventions: dietary management education, guidance, and counseling  encourage exercise .      The patient was counseled on the dangers of tobacco use.     I attest that current meds have been reviewed and are accurate.

## 2017-12-01 ENCOUNTER — Encounter: Payer: MEDICARE | Primary: Internal Medicine

## 2017-12-28 ENCOUNTER — Ambulatory Visit
Admit: 2017-12-28 | Discharge: 2017-12-28 | Payer: MEDICARE | Attending: Interventional Cardiology | Primary: Internal Medicine

## 2017-12-28 ENCOUNTER — Ambulatory Visit: Attending: Interventional Cardiology | Primary: Internal Medicine

## 2017-12-28 DIAGNOSIS — I2510 Atherosclerotic heart disease of native coronary artery without angina pectoris: Secondary | ICD-10-CM

## 2017-12-28 MED ORDER — NITROGLYCERIN 0.4 MG SUBLINGUAL TAB
0.4 mg | SUBLINGUAL | 0 refills | Status: DC | PRN
Start: 2017-12-28 — End: 2018-05-05

## 2017-12-28 NOTE — Progress Notes (Signed)
The patient is in office for follow-up visit for history of coronary disease, status post coronary bypass surgery, hyperlipidemia, hypertension and result of the echocardiogram.    Since the patient's last visit, she had an outpatient echocardiogram.  The echo showed normal left ventricular systolic function and wall motion.    In the office, the patient denied chest discomfort, shortness of breath at rest, dyspnea on exertion, PND or orthopnea.  She also denied dizziness, palpitation or syncope.    Physical examination showed heart rate 62 and regular, blood pressure 130/62.  Patient weighs 123 pounds which is unchanged from before..  Patient is awake and oriented ??3, in no acute distress..  Neck: Supple, no JVD, bilateral carotid bruit.  Lungs: Clear to auscultation.  Heart: Normal S1, normal S2, no murmur.  Abdomen: Soft, nontender, no abdominal bruit noted.  Extremities: No peripheral edema, full range of motion, steady gait.  Neurological: Grossly intact.    Twelve-lead ECG showed normal sinus rhythm, within normal limit.    Impression: 1 coronary artery disease, status post coronary bypass surgery.  The patient has remained stable.  2 hypertension.  3 hyperlipidemia.  4 diabetes.  5  bilateral carotid bruit, the patient is asymptomatic.    Recommendation: Continue patient on her current medical therapy.    I have discussed the result of the echocardiography with the patient and her daughter.    Discussed the patient's above normal BMI with the patient.  I have recommended the following interventions: dietary management education, guidance, and counseling  encourage exercise .      The patient was counseled on the dangers of tobacco use.     I attest that current meds have been reviewed and are accurate.

## 2017-12-28 NOTE — Progress Notes (Signed)
The patient is in office for follow-up visit for history of coronary disease, status post coronary bypass surgery, hyperlipidemia, hypertension and result of the echocardiogram.    Since the patient's last visit, she had an outpatient echocardiogram.  The echo showed normal left ventricular systolic function and wall motion.    In the office, the patient denied chest discomfort, shortness of breath at rest, dyspnea on exertion, PND or orthopnea.  She also denied dizziness, palpitation or syncope.    Physical examination showed heart rate 62 and regular, blood pressure 130/62.  Patient weighs 123 pounds which is unchanged from before..  Patient is awake and oriented ??3, in no acute distress..  Neck: Supple, no JVD, bilateral carotid bruit.  Lungs: Clear to auscultation.  Heart: Normal S1, normal S2, no murmur.  Abdomen: Soft, nontender, no abdominal bruit noted.  Extremities: No peripheral edema, full range of motion, steady gait.  Neurological: Grossly intact.    Twelve-lead ECG showed normal sinus rhythm, within normal limit.    Impression: 1 coronary artery disease, status post coronary bypass surgery.  The patient has remained stable.  2 hypertension.  3 hyperlipidemia.  4 diabetes.  5  bilateral carotid bruit, the patient is asymptomatic.    Recommendation: Continue patient on her current medical therapy.    I have discussed the result of the echocardiography with the patient and her daughter.    Discussed the patient's above normal BMI with the patient.  I have recommended the following interventions: dietary management education, guidance, and counseling  encourage exercise .      The patient was counseled on the dangers of tobacco use.     I attest that current meds have been reviewed and are accurate.

## 2018-01-20 ENCOUNTER — Encounter: Attending: Interventional Cardiology | Primary: Internal Medicine

## 2018-05-05 ENCOUNTER — Ambulatory Visit: Admit: 2018-05-05 | Payer: MEDICARE | Attending: Interventional Cardiology | Primary: Internal Medicine

## 2018-05-05 ENCOUNTER — Ambulatory Visit: Attending: Interventional Cardiology | Primary: Internal Medicine

## 2018-05-05 DIAGNOSIS — I2510 Atherosclerotic heart disease of native coronary artery without angina pectoris: Secondary | ICD-10-CM

## 2018-05-05 NOTE — Progress Notes (Signed)
The patient is in office for follow-up visit for history of coronary disease, status post bypass surgery, hypertension, hyperlipidemia.    Since the patient's last visit, she denied symptom of chest discomfort, shortness of breath at rest, dyspnea on exertion, PND or orthopnea.  The patient also denies dizziness, palpitation.  She is compliant with her medications.    Physical examination showed heart rate 70 and regular, blood pressure 130/72 with repeat blood pressure 150/60.  Patient weighs 123 pounds which is unchanged from before.  Patient is awake and oriented ??3, in no acute distress..  Neck: Supple, no JVD, bilateral carotid bruit.  Lungs: Clear to auscultation.  Heart: Normal S1, normal S2, 2/6 systolic ejection murmur at the left sternal border.  Abdomen: Soft, nontender, no abdominal bruit noted.  Extremities: No peripheral edema, full range of motion, steady gait.  Neurological: Grossly intact.    Impression: 1 coronary artery disease, status post coronary artery bypass surgery.  The patient is hemodynamically stable.  2 hypertension.  3 hyperlipidemia.  4 diabetes.  5 bilateral carotid bruit.    Recommendation: Continue patient on her current medications.    Follow-up in 4 months.    Discussed the patient's above normal BMI with the patient.  I have recommended the following interventions: dietary management education, guidance, and counseling  encourage exercise .      The patient was counseled on the dangers of tobacco use.     I attest that current meds have been reviewed and are accurate.

## 2018-05-05 NOTE — Progress Notes (Signed)
The patient is in office for follow-up visit for history of coronary disease, status post bypass surgery, hypertension, hyperlipidemia.    Since the patient's last visit, she denied symptom of chest discomfort, shortness of breath at rest, dyspnea on exertion, PND or orthopnea.  The patient also denies dizziness, palpitation.  She is compliant with her medications.    Physical examination showed heart rate 70 and regular, blood pressure 130/72 with repeat blood pressure 150/60.  Patient weighs 123 pounds which is unchanged from before.  Patient is awake and oriented ??3, in no acute distress..  Neck: Supple, no JVD, bilateral carotid bruit.  Lungs: Clear to auscultation.  Heart: Normal S1, normal S2, 2/6 systolic ejection murmur at the left sternal border.  Abdomen: Soft, nontender, no abdominal bruit noted.  Extremities: No peripheral edema, full range of motion, steady gait.  Neurological: Grossly intact.    Impression: 1 coronary artery disease, status post coronary artery bypass surgery.  The patient is hemodynamically stable.  2 hypertension.  3 hyperlipidemia.  4 diabetes.  5 bilateral carotid bruit.    Recommendation: Continue patient on her current medications.    Follow-up in 4 months.    Discussed the patient's above normal BMI with the patient.  I have recommended the following interventions: dietary management education, guidance, and counseling  encourage exercise .      The patient was counseled on the dangers of tobacco use.     I attest that current meds have been reviewed and are accurate.

## 2018-09-05 ENCOUNTER — Ambulatory Visit
Admit: 2018-09-05 | Discharge: 2018-09-05 | Payer: MEDICARE | Attending: Interventional Cardiology | Primary: Internal Medicine

## 2018-09-05 ENCOUNTER — Ambulatory Visit: Attending: Interventional Cardiology | Primary: Internal Medicine

## 2018-09-05 DIAGNOSIS — I2510 Atherosclerotic heart disease of native coronary artery without angina pectoris: Secondary | ICD-10-CM

## 2018-09-05 NOTE — Progress Notes (Signed)
The patient is in office for follow-up visit for history of coronary disease, status post coronary artery bypass surgery, hypertension, hyperlipidemia and carotid bruit.    Since the patient's last visit, she denied chest discomfort, shortness of breath at rest, dyspnea on exertion, PND or orthopnea.  She also denied dizziness, palpitation or syncope.  The patient is compliant with her medications.    Physical examination showed heart rate 72 and regular, blood pressure 128/70 with repeat blood pressure 154/72.  Patient weighs 126 pounds which is an increase of 3 pounds since last visit.  Patient is awake and oriented ??3, in no acute distress..  Neck: Supple, no JVD, bilateral carotid bruit.  Lungs: Clear to auscultation.  Heart: Normal S1, normal S2,  2/6 systolic ejection murmur at the left sternal border.  Abdomen: Soft, nontender, no abdominal bruit noted.  Extremities: No peripheral edema, full range of motion, steady gait.  Neurological: Grossly intact.    Twelve-lead ECG showed normal sinus rhythm, within normal limit.    Laboratory examination show total cholesterol 144, HDL 60, LDL 65 and triglyceride 103.  Hemoglobin A1c was 7.7.    Impression: 1 coronary artery disease, status post bypass surgery.  The patient has remained stable.  2 hypertension.  3 hyperlipidemia, excellent lipid profile.  4 bilateral carotid bruit.  5 diabetes.    Recommendation: Continue patient her current medical therapy.    Carotid ultrasound.    Follow-up in 4 months.    Discussed the patient's above normal BMI with the patient.  I have recommended the following interventions: dietary management education, guidance, and counseling  encourage exercise .      The patient was counseled on the dangers of tobacco use.     I attest that current meds have been reviewed and are accurate.

## 2018-09-05 NOTE — Progress Notes (Signed)
The patient is in office for follow-up visit for history of coronary disease, status post coronary artery bypass surgery, hypertension, hyperlipidemia and carotid bruit.    Since the patient's last visit, she denied chest discomfort, shortness of breath at rest, dyspnea on exertion, PND or orthopnea.  She also denied dizziness, palpitation or syncope.  The patient is compliant with her medications.    Physical examination showed heart rate 72 and regular, blood pressure 128/70 with repeat blood pressure 154/72.  Patient weighs 126 pounds which is an increase of 3 pounds since last visit.  Patient is awake and oriented ??3, in no acute distress..  Neck: Supple, no JVD, bilateral carotid bruit.  Lungs: Clear to auscultation.  Heart: Normal S1, normal S2,  2/6 systolic ejection murmur at the left sternal border.  Abdomen: Soft, nontender, no abdominal bruit noted.  Extremities: No peripheral edema, full range of motion, steady gait.  Neurological: Grossly intact.    Twelve-lead ECG showed normal sinus rhythm, within normal limit.    Laboratory examination show total cholesterol 144, HDL 60, LDL 65 and triglyceride 103.  Hemoglobin A1c was 7.7.    Impression: 1 coronary artery disease, status post bypass surgery.  The patient has remained stable.  2 hypertension.  3 hyperlipidemia, excellent lipid profile.  4 bilateral carotid bruit.  5 diabetes.    Recommendation: Continue patient her current medical therapy.    Carotid ultrasound.    Follow-up in 4 months.    Discussed the patient's above normal BMI with the patient.  I have recommended the following interventions: dietary management education, guidance, and counseling  encourage exercise .      The patient was counseled on the dangers of tobacco use.     I attest that current meds have been reviewed and are accurate.

## 2018-09-16 ENCOUNTER — Inpatient Hospital Stay: Admit: 2018-09-16 | Payer: MEDICARE | Attending: Interventional Cardiology | Primary: Internal Medicine

## 2018-09-16 DIAGNOSIS — I6523 Occlusion and stenosis of bilateral carotid arteries: Secondary | ICD-10-CM

## 2019-01-05 ENCOUNTER — Encounter: Attending: Interventional Cardiology | Primary: Internal Medicine

## 2019-03-23 ENCOUNTER — Encounter: Payer: MEDICARE | Attending: Interventional Cardiology | Primary: Internal Medicine

## 2019-10-17 LAB — METABOLIC PANEL, COMPREHENSIVE
ALB/GLOBRATIO: 1.7 (calc) (ref 1.0–2.5)
ALT (SGPT): 12 U/L (ref 6–29)
AST (SGOT): 17 U/L (ref 10–35)
Albumin: 4.1 g/dL (ref 3.6–5.1)
Alkaline Phosphatase, total: 61 U/L (ref 37–153)
BUN: 15 mg/dL (ref 7–25)
Bilirubin, total: 0.4 mg/dL (ref 0.2–1.2)
CO2: 30 mmol/L (ref 20–32)
Calcium: 9.7 mg/dL (ref 8.6–10.4)
Chloride: 104 mmol/L (ref 98–110)
Creatinine: 0.74 mg/dL (ref 0.60–0.88)
GFR est AA: 86 mL/min/{1.73_m2} (ref >=60)
GFR est non-AA: 74 mL/min/{1.73_m2} (ref >=60)
Globulin: 2.4 g/dL (calc) (ref 1.9–3.7)
Glucose: 111 mg/dL — ABNORMAL HIGH (ref 65–99)
Potassium: 4.3 mmol/L (ref 3.5–5.3)
Protein, total: 6.5 g/dL (ref 6.1–8.1)
Sodium: 142 mmol/L (ref 135–146)

## 2019-10-17 LAB — CBC W/O DIFF
HCT: 31.6 % — ABNORMAL LOW (ref 35.0–45.0)
HGB: 10.2 g/dL — ABNORMAL LOW (ref 11.7–15.5)
MCH: 29.4 pg (ref 27.0–33.0)
MCHC: 32.3 g/dL (ref 32.0–36.0)
MCV: 91.1 fL (ref 80.0–100.0)
MEAN PLATELET VOLUME: 10.4 fL (ref 7.5–12.5)
PLATELET: 247 10*3/uL (ref 140–400)
RBC: 3.47 10*6/uL — ABNORMAL LOW (ref 3.80–5.10)
RDW: 12.2 % (ref 11.0–15.0)
WBC: 5.9 10*3/uL (ref 3.8–10.8)

## 2019-10-17 LAB — LIPID PANEL
Chol/HDL Ratio: 2.1 (calc) (ref <5.0)
Cholesterol, Total: 132 mg/dL (ref <200)
Cholesterol, total: 132 mg/dL (ref <200)
Cholesterol/HDL ratio: 2.1 (calc) (ref <5.0)
HDL Cholesterol: 62 mg/dL (ref >=50)
HDL: 62 mg/dL (ref >=50)
LDL Cholesterol: 53 mg/dL (calc)
LDL-CHOLESTEROL: 53 mg/dL (calc)
Non-HDL Cholesterol: 70 mg/dL (calc) (ref <130)
Non-HDL Cholesterol: 70 mg/dL (calc) (ref <130)
Triglyceride: 87 mg/dL (ref <150)
Triglycerides: 87 mg/dL (ref <150)

## 2019-10-17 LAB — HEMOGLOBIN A1C W/O EAG
Hemoglobin A1C: 7.1 % of total Hgb — ABNORMAL HIGH (ref <5.7)
Hemoglobin A1c: 7.1 % of total Hgb — ABNORMAL HIGH (ref <5.7)

## 2019-10-17 LAB — CBC
Hematocrit: 31.6 % — ABNORMAL LOW (ref 35.0–45.0)
Hemoglobin: 10.2 g/dL — ABNORMAL LOW (ref 11.7–15.5)
MCH: 29.4 pg (ref 27.0–33.0)
MCHC: 32.3 g/dL (ref 32.0–36.0)
MCV: 91.1 fL (ref 80.0–100.0)
MPV: 10.4 fL (ref 7.5–12.5)
Platelets: 247 10*3/uL (ref 140–400)
RBC: 3.47 10*6/uL — ABNORMAL LOW (ref 3.80–5.10)
RDW: 12.2 % (ref 11.0–15.0)
WBC: 5.9 10*3/uL (ref 3.8–10.8)

## 2019-10-17 LAB — COMPREHENSIVE METABOLIC PANEL
ALT: 12 U/L (ref 6–29)
AST: 17 U/L (ref 10–35)
Albumin/Globulin Ratio: 1.7 (calc) (ref 1.0–2.5)
Albumin: 4.1 g/dL (ref 3.6–5.1)
Alkaline Phosphatase: 61 U/L (ref 37–153)
BUN: 15 mg/dL (ref 7–25)
CO2: 30 mmol/L (ref 20–32)
Calcium: 9.7 mg/dL (ref 8.6–10.4)
Chloride: 104 mmol/L (ref 98–110)
Creatinine: 0.74 mg/dL (ref 0.60–0.88)
EGFR IF NonAfrican American: 74 mL/min/{1.73_m2} (ref >=60)
GFR African American: 86 mL/min/{1.73_m2} (ref >=60)
Globulin: 2.4 g/dL (calc) (ref 1.9–3.7)
Glucose: 111 mg/dL — ABNORMAL HIGH (ref 65–99)
Potassium: 4.3 mmol/L (ref 3.5–5.3)
Sodium: 142 mmol/L (ref 135–146)
Total Bilirubin: 0.4 mg/dL (ref 0.2–1.2)
Total Protein: 6.5 g/dL (ref 6.1–8.1)

## 2019-10-23 ENCOUNTER — Ambulatory Visit
Admit: 2019-10-23 | Discharge: 2019-10-23 | Payer: MEDICARE | Attending: Interventional Cardiology | Primary: Internal Medicine

## 2019-10-23 ENCOUNTER — Ambulatory Visit: Attending: Interventional Cardiology | Primary: Internal Medicine

## 2019-10-23 DIAGNOSIS — I2510 Atherosclerotic heart disease of native coronary artery without angina pectoris: Secondary | ICD-10-CM

## 2019-10-23 NOTE — Progress Notes (Signed)
The patient is in office for follow-up visit for history of coronary disease, status post coronary artery bypass surgery, history of hypertension, hyperlipidemia and carotid bruit.    Since the patient's last visit in February 2020, she has felt well.  The patient denies symptom of chest discomfort, shortness of breath at rest, dyspnea on exertion, PND or orthopnea.  She also denied dizziness, palpitation or syncope.  According to patient and her daughter, she is mostly housebound during the pandemic year.  However, she tried to walk and exercise at home is much as possible.  The patient is compliant with her medications.  She has received her first dose of vaccine last week.    Physical examination showed heart rate 72 and regular, blood pressure 140/50.  Patient weighs 120 pounds which is a decrease of 6 pounds since her last visit.  Patient is awake and oriented ??3, in no acute distress..  Neck: Supple, no JVD, bilateral carotid bruit.  Lungs: Clear to auscultation.  Heart: Normal S1, normal S2, 2/6 systolic ejection murmur at left sternal border.  Abdomen: Soft, nontender, no abdominal bruit noted.  Extremities: No peripheral edema, full range of motion, steady gait.  Neurological: Grossly intact.    Twelve-lead ECG showed normal sinus rhythm with sinus arrhythmia, nonspecific ST-T wave changes.    Laboratory examination from March of this year showed total cholesterol 132, HDL 62, LDL 53 and triglyceride of 87.  Hemoglobin A1c was 7.1.    Impression: 1 coronary artery disease, status post coronary artery bypass surgery.  The patient is asymptomatic.  Her last functional study was 2015.  2 hypertension, blood pressure borderline elevated.  3 hyperlipidemia, excellent lipid profile.  4 bilateral carotid bruit.  5 history of diabetes with elevated hemoglobin A1c.    Recommendation: Continue patient on her current medical therapy.  Outpatient pharmacological stress Myoview test to assess current status of ischemic  heart disease.  Carotid ultrasound to assess carotid stenosis.    Follow-up in 3 months.        Discussed the patient's above normal BMI with the patient.  I have recommended the following interventions: dietary management education, guidance, and counseling  encourage exercise .      The patient was counseled on the dangers of tobacco use.     I attest that current meds have been reviewed and are accurate.

## 2019-10-23 NOTE — Progress Notes (Signed)
The patient is in office for follow-up visit for history of coronary disease, status post coronary artery bypass surgery, history of hypertension, hyperlipidemia and carotid bruit.    Since the patient's last visit in February 2020, she has felt well.  The patient denies symptom of chest discomfort, shortness of breath at rest, dyspnea on exertion, PND or orthopnea.  She also denied dizziness, palpitation or syncope.  According to patient and her daughter, she is mostly housebound during the pandemic year.  However, she tried to walk and exercise at home is much as possible.  The patient is compliant with her medications.  She has received her first dose of vaccine last week.    Physical examination showed heart rate 72 and regular, blood pressure 140/50.  Patient weighs 120 pounds which is a decrease of 6 pounds since her last visit.  Patient is awake and oriented ??3, in no acute distress..  Neck: Supple, no JVD, bilateral carotid bruit.  Lungs: Clear to auscultation.  Heart: Normal S1, normal S2, 2/6 systolic ejection murmur at left sternal border.  Abdomen: Soft, nontender, no abdominal bruit noted.  Extremities: No peripheral edema, full range of motion, steady gait.  Neurological: Grossly intact.    Twelve-lead ECG showed normal sinus rhythm with sinus arrhythmia, nonspecific ST-T wave changes.    Laboratory examination from March of this year showed total cholesterol 132, HDL 62, LDL 53 and triglyceride of 87.  Hemoglobin A1c was 7.1.    Impression: 1 coronary artery disease, status post coronary artery bypass surgery.  The patient is asymptomatic.  Her last functional study was 2015.  2 hypertension, blood pressure borderline elevated.  3 hyperlipidemia, excellent lipid profile.  4 bilateral carotid bruit.  5 history of diabetes with elevated hemoglobin A1c.    Recommendation: Continue patient on her current medical therapy.  Outpatient pharmacological stress Myoview test to assess current status of ischemic  heart disease.  Carotid ultrasound to assess carotid stenosis.    Follow-up in 3 months.        Discussed the patient's above normal BMI with the patient.  I have recommended the following interventions: dietary management education, guidance, and counseling  encourage exercise .      The patient was counseled on the dangers of tobacco use.     I attest that current meds have been reviewed and are accurate.

## 2019-10-24 ENCOUNTER — Encounter

## 2019-11-22 ENCOUNTER — Ambulatory Visit: Payer: MEDICARE | Primary: Internal Medicine

## 2019-11-29 ENCOUNTER — Ambulatory Visit: Payer: MEDICARE | Primary: Internal Medicine

## 2019-11-29 ENCOUNTER — Inpatient Hospital Stay: Admit: 2019-11-29 | Payer: MEDICARE | Attending: Interventional Cardiology | Primary: Internal Medicine

## 2019-11-29 DIAGNOSIS — I6523 Occlusion and stenosis of bilateral carotid arteries: Secondary | ICD-10-CM

## 2020-01-22 ENCOUNTER — Encounter: Attending: Interventional Cardiology | Primary: Internal Medicine

## 2020-02-05 ENCOUNTER — Ambulatory Visit
Admit: 2020-02-05 | Discharge: 2020-02-05 | Payer: MEDICARE | Attending: Interventional Cardiology | Primary: Internal Medicine

## 2020-02-05 ENCOUNTER — Ambulatory Visit: Attending: Interventional Cardiology | Primary: Internal Medicine

## 2020-02-05 DIAGNOSIS — I2510 Atherosclerotic heart disease of native coronary artery without angina pectoris: Secondary | ICD-10-CM

## 2020-02-05 NOTE — Progress Notes (Signed)
The patient is in office for follow-up visit for coronary disease, status post coronary artery bypass surgery, hypertension, hyperlipidemia.    Since the patient's last visit, she has done well.  Patient denies symptom of chest discomfort, shortness of breath at rest, dyspnea on exertion, PND or orthopnea.  She also denied dizziness, palpitation or syncope.  The patient has refused to undergo repeat pharmacological stress Myoview test.  However, she did have a carotid ultrasound done.    Physical examination showed heart rate 70 and regular, blood pressure 130/60.  Patient weighs 120 pounds which is unchanged from before.  Patient is awake and oriented ??3, in no acute distress..  Neck: Supple, no JVD, bilateral carotid bruit.  Lungs: Clear to auscultation.  Heart: Normal S1, normal S2, 2/6 systolic ejection murmur at upper left sternal border.  Abdomen: Soft, nontender, no abdominal bruit noted.  Extremities: No peripheral edema, full range of motion, steady gait.  Neurological: Grossly intact.    Carotid ultrasound from May 2021 showed 50 to 69% stenosis in the right internal carotid artery and no significant disease in the left internal carotid artery.    Impression: 1 coronary disease, status post coronary artery bypass surgery.  The patient is angina free.  2 carotid bruit, moderate disease in the right internal carotid artery.  3 history of hypertension.  4 diabetes.  5 hyperlipidemia.    Recommendation: Continue patient on her current medical therapy given the stable cardiac status.    Follow-up in 3 months.    I have discussed the result of the carotid ultrasound with the patient.    The patient stated that she may move to [place] with her daughter in September.  She will call to cancel appointment if she does move.    Discussed the patient's above normal BMI with the patient.  I have recommended the following interventions: dietary management education, guidance, and counseling  encourage exercise .       The patient was counseled on the dangers of tobacco use.     I attest that current meds have been reviewed and are accurate.

## 2020-05-06 ENCOUNTER — Encounter: Attending: Interventional Cardiology | Primary: Internal Medicine

## 2020-12-20 ENCOUNTER — Other Ambulatory Visit: Payer: Self-pay

## 2020-12-20 ENCOUNTER — Ambulatory Visit (INDEPENDENT_AMBULATORY_CARE_PROVIDER_SITE_OTHER): Payer: Medicare Other | Admitting: Family Medicine

## 2020-12-20 ENCOUNTER — Encounter: Payer: Self-pay | Admitting: Family Medicine

## 2020-12-20 VITALS — BP 138/70 | HR 69 | Temp 97.3°F | Ht 60.0 in | Wt 124.4 lb

## 2020-12-20 DIAGNOSIS — E785 Hyperlipidemia, unspecified: Secondary | ICD-10-CM

## 2020-12-20 DIAGNOSIS — I10 Essential (primary) hypertension: Secondary | ICD-10-CM

## 2020-12-20 DIAGNOSIS — Z7689 Persons encountering health services in other specified circumstances: Secondary | ICD-10-CM | POA: Diagnosis not present

## 2020-12-20 DIAGNOSIS — E1169 Type 2 diabetes mellitus with other specified complication: Secondary | ICD-10-CM

## 2020-12-20 DIAGNOSIS — E119 Type 2 diabetes mellitus without complications: Secondary | ICD-10-CM

## 2020-12-20 HISTORY — DX: Essential (primary) hypertension: I10

## 2020-12-20 HISTORY — DX: Type 2 diabetes mellitus without complications: E11.9

## 2020-12-20 HISTORY — DX: Type 2 diabetes mellitus with other specified complication: E11.69

## 2020-12-20 LAB — LIPID PANEL
Cholesterol: 153 mg/dL (ref 0–200)
HDL: 59.8 mg/dL (ref >39.00)
LDL Cholesterol: 71 mg/dL (ref 0–99)
NonHDL: 92.78
Total CHOL/HDL Ratio: 3
Triglycerides: 111 mg/dL (ref 0.0–149.0)
VLDL: 22.2 mg/dL (ref 0.0–40.0)

## 2020-12-20 LAB — COMPREHENSIVE METABOLIC PANEL
ALT: 13 U/L (ref 0–35)
AST: 17 U/L (ref 0–37)
Albumin: 4.2 g/dL (ref 3.5–5.2)
Alkaline Phosphatase: 70 U/L (ref 39–117)
BUN: 18 mg/dL (ref 6–23)
CO2: 29 mEq/L (ref 19–32)
Calcium: 9.9 mg/dL (ref 8.4–10.5)
Chloride: 100 mEq/L (ref 96–112)
Creatinine, Ser: 0.8 mg/dL (ref 0.40–1.20)
GFR: 66.87 mL/min (ref >60.00)
Glucose, Bld: 121 mg/dL — ABNORMAL HIGH (ref 70–99)
Potassium: 4.5 mEq/L (ref 3.5–5.1)
Sodium: 139 mEq/L (ref 135–145)
Total Bilirubin: 0.5 mg/dL (ref 0.2–1.2)
Total Protein: 6.8 g/dL (ref 6.0–8.3)

## 2020-12-20 LAB — HEMOGLOBIN A1C: Hgb A1c MFr Bld: 8.2 % — ABNORMAL HIGH (ref 4.6–6.5)

## 2020-12-20 LAB — MICROALBUMIN / CREATININE URINE RATIO
Creatinine,U: 60.2 mg/dL
Microalb Creat Ratio: 6.1 mg/g (ref 0.0–30.0)
Microalb, Ur: 3.7 mg/dL — ABNORMAL HIGH (ref 0.0–1.9)

## 2020-12-20 MED ORDER — METFORMIN HCL 1000 MG PO TABS: 1000.0000 mg | ORAL_TABLET | Freq: Two times a day (BID) | ORAL | 3 refills | Status: DC

## 2020-12-20 MED ORDER — HYDROCHLOROTHIAZIDE 12.5 MG PO CAPS: 12.5000 mg | ORAL_CAPSULE | Freq: Every day | ORAL | 3 refills | Status: DC

## 2020-12-20 MED ORDER — ATORVASTATIN CALCIUM 10 MG PO TABS: 10.0000 mg | ORAL_TABLET | Freq: Every day | ORAL | 3 refills | Status: DC

## 2020-12-20 MED ORDER — AMLODIPINE BESYLATE 5 MG PO TABS
5.0000 mg | ORAL_TABLET | Freq: Two times a day (BID) | ORAL | 3 refills | Status: DC
Start: 2020-12-20 — End: 2021-08-25

## 2020-12-20 MED ORDER — NITROGLYCERIN 0.4 MG SL SUBL: 0.4000 mg | SUBLINGUAL_TABLET | SUBLINGUAL | 2 refills | Status: DC | PRN

## 2020-12-20 MED ORDER — ISOSORBIDE MONONITRATE ER 30 MG PO TB24
30.0000 mg | ORAL_TABLET | Freq: Every day | ORAL | 3 refills | Status: DC
Start: 2020-12-20 — End: 2021-08-25

## 2020-12-20 MED ORDER — IRBESARTAN 300 MG PO TABS
300.0000 mg | ORAL_TABLET | Freq: Every day | ORAL | 3 refills | Status: DC
Start: 2020-12-20 — End: 2021-08-25

## 2020-12-20 NOTE — Progress Notes (Signed)
Pamela Barnett is a 87 y.o. female  Chief Complaint  Patient presents with  . Establish Care    Np here to est care.  Pt would like referral for Eye dr, Dentist, and cardiologist.    HPI: Pamela Barnett is a 87 y.o. female seen today to establish care with our office. She is accompanied by her daughter. She had a small amount of oatmeal this AM but would like to do labs.  Needs referrals to ophthalmologist, cardiologist, dentist. Pt with h/o DM, HTN, HDL, CAD w/ CABG at 87yo.  She needs medication refills for all meds.  Last labs in 09/2020. A1C = 7.4 Daughter in [place] was pts previous PCP. Pt states she has had the pneumonia vaccines. She has not had shingrix vaccine but is agreeable to getting it. Had covid vaccine x 2 and booster x 1.   Past Medical History:  Diagnosis Date  . Diabetes mellitus without complication (HCC)   . Hyperlipidemia   . Hypertension     Past Surgical History:  Procedure Laterality Date  . cataracts surgery  2011  . EYE SURGERY    . VEIN BYPASS SURGERY  1998    Social History   Socioeconomic History  . Marital status: Widowed    Spouse name: Not on file  . Number of children: Not on file  . Years of education: Not on file  . Highest education level: Not on file  Occupational History  . Not on file  Tobacco Use  . Smoking status: Never Smoker  . Smokeless tobacco: Never Used  Substance and Sexual Activity  . Alcohol use: Never  . Drug use: Never  . Sexual activity: Not on file  Other Topics Concern  . Not on file  Social History Narrative  . Not on file   Social Determinants of Health   Financial Resource Strain: Not on file  Food Insecurity: Not on file  Transportation Needs: Not on file  Physical Activity: Not on file  Stress: Not on file  Social Connections: Not on file  Intimate Partner Violence: Not on file    Family History  Problem Relation Age of Onset  . Myocarditis Father   . Diabetes Daughter        There is no immunization history on file for this patient.  Outpatient Encounter Medications as of 12/20/2020  Medication Sig  . amLODipine (NORVASC) 5 MG tablet Take 5 mg by mouth in the morning and at bedtime.  . Ascorbic Acid (VITAMIN C) 100 MG tablet Take 100 mg by mouth daily.  Marland Kitchen atorvastatin (LIPITOR) 10 MG tablet Take 10 mg by mouth daily.  . Cholecalciferol (VITAMIN D3) 125 MCG (5000 UT) CAPS Take by mouth.  Marland Kitchen glucose blood test strip 1 each by Other route as needed for other (ACCU Check test strips). Use as instructed  . hydrochlorothiazide (MICROZIDE) 12.5 MG capsule Take 12.5 mg by mouth daily.  . irbesartan (AVAPRO) 300 MG tablet Take 300 mg by mouth daily.  . isosorbide mononitrate (IMDUR) 30 MG 24 hr tablet Take 30 mg by mouth daily.  Pamela Barnett Oil 350 MG CAPS Take by mouth daily.  . metFORMIN (GLUCOPHAGE) 1000 MG tablet Take 1,000 mg by mouth 2 (two) times daily with a meal.  . Multiple Vitamins-Minerals (IMMUNE SUPPORT VITAMIN C) PACK Take by mouth daily. Airborne   No facility-administered encounter medications on file as of 12/20/2020.     ROS: Pertinent positives and negatives noted in HPI. Remainder of ROS  non-contributory   Allergies  Allergen Reactions  . Penicillins     BP 138/70 (BP Location: Left Arm, Patient Position: Sitting, Cuff Size: Normal)   Pulse 69   Temp (!) 97.3 F (36.3 C) (Temporal)   Ht 5' (1.524 m)   Wt 124 lb 6.4 oz (56.4 kg)   SpO2 98%   BMI 24.30 kg/m   Wt Readings from Last 3 Encounters:  12/20/20 124 lb 6.4 oz (56.4 kg)   Temp Readings from Last 3 Encounters:  12/20/20 (!) 97.3 F (36.3 C) (Temporal)   BP Readings from Last 3 Encounters:  12/20/20 138/70   Pulse Readings from Last 3 Encounters:  12/20/20 69     Physical Exam Constitutional:      General: She is not in acute distress.    Appearance: Normal appearance. She is not ill-appearing.  Cardiovascular:     Rate and Rhythm: Normal rate and regular rhythm.      Pulses: Normal pulses.     Heart sounds: Murmur heard.    Pulmonary:     Effort: Pulmonary effort is normal. No respiratory distress.     Breath sounds: Normal breath sounds. No wheezing or rhonchi.  Musculoskeletal:     Right lower leg: No edema.     Left lower leg: No edema.  Neurological:     Mental Status: She is alert and oriented to person, place, and time.  Psychiatric:        Mood and Affect: Mood normal.        Behavior: Behavior normal.      A/P:  1. Encounter to establish care with new doctor - due for shingrix vaccine and pt will get at local pharmacy since she has Medicare - UTD on dexa, mammo - will complete records release today to get records from previous PCP in [place]  2. Primary hypertension - controlled, at goal - Ambulatory referral to Cardiology Refill: - hydrochlorothiazide (MICROZIDE) 12.5 MG capsule; Take 1 capsule (12.5 mg total) by mouth daily.  Dispense: 90 capsule; Refill: 3 - isosorbide mononitrate (IMDUR) 30 MG 24 hr tablet; Take 1 tablet (30 mg total) by mouth daily.  Dispense: 90 tablet; Refill: 3 - irbesartan (AVAPRO) 300 MG tablet; Take 1 tablet (300 mg total) by mouth daily.  Dispense: 90 tablet; Refill: 3 - amLODipine (NORVASC) 5 MG tablet; Take 1 tablet (5 mg total) by mouth in the morning and at bedtime.  Dispense: 90 tablet; Refill: 3 - Comprehensive metabolic panel  3. Type 2 diabetes mellitus without complication, without long-term current use of insulin (HCC) - last A1C = 7.4 in 09/2020 (pt reported) - Ambulatory referral to Ophthalmology Refill: - metFORMIN (GLUCOPHAGE) 1000 MG tablet; Take 1 tablet (1,000 mg total) by mouth 2 (two) times daily with a meal.  Dispense: 180 tablet; Refill: 3 - Microalbumin / creatinine urine ratio - Hemoglobin A1c  4. Hyperlipidemia associated with type 2 diabetes mellitus (HCC) - Ambulatory referral to Cardiology Refill: - atorvastatin (LIPITOR) 10 MG tablet; Take 1 tablet (10 mg total) by mouth  daily.  Dispense: 90 tablet; Refill: 3 - Lipid panel  - f/u in 3 mo or sooner PRN   This visit occurred during the SARS-CoV-2 public health emergency.  Safety protocols were in place, including screening questions prior to the visit, additional usage of staff PPE, and extensive cleaning of exam room while observing appropriate contact time as indicated for disinfecting solutions.

## 2020-12-20 NOTE — Patient Instructions (Addendum)
Shingles (shingrix) vaccine - local pharmacy. 2 shot series, 6 mo apart covid booster x 2   [facility] [address] [place], [place] [address] [phone] [email or web address]  [facility] [address] [place], [place] [address] [phone] [email or web address]

## 2021-01-16 ENCOUNTER — Encounter: Payer: Self-pay | Admitting: Family Medicine

## 2021-01-16 ENCOUNTER — Telehealth: Payer: Self-pay

## 2021-01-16 MED ORDER — ACCU-CHEK FASTCLIX LANCETS MISC: 1 refills | Status: DC

## 2021-01-16 MED ORDER — GLUCOSE BLOOD VI STRP: ORAL_STRIP | 12 refills | Status: DC

## 2021-01-16 NOTE — Telephone Encounter (Signed)
Pt's daughter calling to request test strips and lancets to be sent into pharmacy.  Accucheck lancets and test strips.

## 2021-01-17 ENCOUNTER — Other Ambulatory Visit: Payer: Self-pay

## 2021-01-20 ENCOUNTER — Other Ambulatory Visit: Payer: Self-pay

## 2021-01-20 ENCOUNTER — Encounter: Payer: Self-pay | Admitting: Family Medicine

## 2021-01-20 ENCOUNTER — Ambulatory Visit (INDEPENDENT_AMBULATORY_CARE_PROVIDER_SITE_OTHER): Payer: Medicare Other | Admitting: Family Medicine

## 2021-01-20 VITALS — BP 130/82 | HR 68 | Temp 97.8°F | Ht 60.0 in | Wt 122.8 lb

## 2021-01-20 DIAGNOSIS — R221 Localized swelling, mass and lump, neck: Secondary | ICD-10-CM

## 2021-01-20 DIAGNOSIS — D649 Anemia, unspecified: Secondary | ICD-10-CM

## 2021-01-20 HISTORY — DX: Anemia, unspecified: D64.9

## 2021-01-20 LAB — CBC
HCT: 32.8 % — ABNORMAL LOW (ref 36.0–46.0)
Hemoglobin: 10.6 g/dL — ABNORMAL LOW (ref 12.0–15.0)
MCHC: 32.4 g/dL (ref 30.0–36.0)
MCV: 88.1 fl (ref 78.0–100.0)
Platelets: 284 10*3/uL (ref 150.0–400.0)
RBC: 3.73 Mil/uL — ABNORMAL LOW (ref 3.87–5.11)
RDW: 14.6 % (ref 11.5–15.5)
WBC: 6.2 10*3/uL (ref 4.0–10.5)

## 2021-01-20 LAB — T4, FREE: Free T4: 0.7 ng/dL (ref 0.60–1.60)

## 2021-01-20 LAB — TSH: TSH: 3.16 u[IU]/mL (ref 0.35–4.50)

## 2021-01-20 NOTE — Progress Notes (Signed)
[facility] [address] [place] [address] Dept: [phone] Dept Fax: [phone]  Office Visit  Subjective:    Patient ID: Pamela Barnett, female    DOB: 1932-08-28, 87 y.o..   MRN: [id number]  Chief Complaint  Patient presents with   Acute Visit    C/o having a lump on neck x 1 week.      History of Present Illness:  Patient is in today for evaluation of a lump in her lower anterior neck. Pamela Barnett is accompanied by her daughter. She notes her mother understands [place] fairly well, but she assists with interpreting and providing history. Pamela Barnett notes a lump came up on her lower neck about a week ago. It has not been tender or red. There is no pain associated with this. She has had no prior lumps like this. She has not had any recent sore throat.  Past Medical History: Patient Active Problem List   Diagnosis Date Noted   Hyperlipidemia associated with type 2 diabetes mellitus (HCC) 12/20/2020   Type 2 diabetes mellitus without complication, without long-term current use of insulin (HCC) 12/20/2020   Primary hypertension 12/20/2020   Past Surgical History:  Procedure Laterality Date   cataracts surgery  2011   EYE SURGERY     VEIN BYPASS SURGERY  1998   Family History  Problem Relation Age of Onset   Myocarditis Father    Diabetes Daughter    Outpatient Medications Prior to Visit  Medication Sig Dispense Refill   Accu-Chek FastClix Lancets MISC Check blood sugars 3 to 4 times a day. 102 each 1   amLODipine (NORVASC) 5 MG tablet Take 1 tablet (5 mg total) by mouth in the morning and at bedtime. 90 tablet 3   Ascorbic Acid (VITAMIN C) 100 MG tablet Take 100 mg by mouth daily.     atorvastatin (LIPITOR) 10 MG tablet Take 1 tablet (10 mg total) by mouth daily. 90 tablet 3   Cholecalciferol (VITAMIN D3) 125 MCG (5000 UT) CAPS Take by mouth.     glucose blood test strip 1 each by Other route as  needed for other (ACCU Check test strips). Use as instructed     glucose blood test strip Use as instructed 100 each 12   hydrochlorothiazide (MICROZIDE) 12.5 MG capsule Take 1 capsule (12.5 mg total) by mouth daily. 90 capsule 3   irbesartan (AVAPRO) 300 MG tablet Take 1 tablet (300 mg total) by mouth daily. 90 tablet 3   isosorbide mononitrate (IMDUR) 30 MG 24 hr tablet Take 1 tablet (30 mg total) by mouth daily. 90 tablet 3   Krill Oil 350 MG CAPS Take by mouth daily.     metFORMIN (GLUCOPHAGE) 1000 MG tablet Take 1 tablet (1,000 mg total) by mouth 2 (two) times daily with a meal. 180 tablet 3   Multiple Vitamins-Minerals (IMMUNE SUPPORT VITAMIN C) PACK Take by mouth daily. Airborne     nitroGLYCERIN (NITROSTAT) 0.4 MG SL tablet Place 1 tablet (0.4 mg total) under the tongue every 5 (five) minutes as needed for chest pain. 15 tablet 2   No facility-administered medications prior to visit.   Allergies  Allergen Reactions   Penicillins     Objective:   Today's Vitals   01/20/21 1025  BP: 130/82  Pulse: 68  Temp: 97.8 F (36.6 C)  TempSrc: Temporal  SpO2: 97%  Weight: 122 lb 12.8 oz (55.7 kg)  Height: 5' (1.524 m)   Body  mass index is 23.98 kg/m.   General: Well developed, well nourished. No acute distress. HEENT: Normocephalic, non-traumatic. Mucous membranes moist. Oropharynx clear. Fair dentition. Neck: Supple. No lymphadenopathy in either the anterior or posterior cervical chain or int he supraclavicular fossae. No   thyromegaly. There is a 1 cm, firm mass under the skin in the midline of the lower neck. There are no palpable masses in   either lobe of the thyroid. Psych: Alert and oriented. Normal mood and affect.  Health Maintenance Due  Topic Date Due   FOOT EXAM  Never done   OPHTHALMOLOGY EXAM  Never done   TETANUS/TDAP  Never done   Zoster Vaccines- Shingrix (1 of 2) Never done   PNA vac Low Risk Adult (1 of 2 - PCV13) Never done   COVID-19 Vaccine (4 - Booster  for Moderna series) 10/29/2020     Assessment & Plan:   1. Lump on neck Pamela Barnett has a firm mass in the anterior lower neck. This may be related to the thyroid isthmus. I will check thyroid studies, a CBC, and refer her for a thyroid/neck ultrasound. Further evaluaiton or managbement will be based on the results.  - TSH - T4, free - CBC - US THYROID; Future  Loyola Mast, MD

## 2021-01-22 ENCOUNTER — Encounter: Payer: Self-pay | Admitting: Family Medicine

## 2021-01-22 NOTE — Telephone Encounter (Signed)
Patients daughter is asking about the [place] that was ordered at last OV. Can you please check on this and give her a call.  Thanks. Dm/cma

## 2021-01-23 ENCOUNTER — Ambulatory Visit
Admission: RE | Admit: 2021-01-23 | Discharge: 2021-01-23 | Disposition: A | Discharge status: Home / Self Care | Payer: Medicare Other | Source: Ambulatory Visit | Attending: Family Medicine | Admitting: Family Medicine

## 2021-01-23 ENCOUNTER — Other Ambulatory Visit: Payer: Self-pay

## 2021-01-23 DIAGNOSIS — E118 Type 2 diabetes mellitus with unspecified complications: Secondary | ICD-10-CM

## 2021-01-23 DIAGNOSIS — R221 Localized swelling, mass and lump, neck: Secondary | ICD-10-CM

## 2021-01-23 DIAGNOSIS — I10 Essential (primary) hypertension: Secondary | ICD-10-CM | POA: Insufficient documentation

## 2021-01-23 DIAGNOSIS — E041 Nontoxic single thyroid nodule: Secondary | ICD-10-CM | POA: Diagnosis not present

## 2021-01-23 DIAGNOSIS — E785 Hyperlipidemia, unspecified: Secondary | ICD-10-CM | POA: Insufficient documentation

## 2021-01-23 HISTORY — DX: Type 2 diabetes mellitus with unspecified complications: E11.8

## 2021-01-23 NOTE — Telephone Encounter (Signed)
Spoke to patient's daughter and advised that an appointment would be needed.  Scheduled a VV for 02/06/21 @ 9:00 am.  Dm/cma

## 2021-01-24 ENCOUNTER — Ambulatory Visit (INDEPENDENT_AMBULATORY_CARE_PROVIDER_SITE_OTHER): Payer: Medicare Other | Admitting: Cardiology

## 2021-01-24 ENCOUNTER — Encounter: Payer: Self-pay | Admitting: Cardiology

## 2021-01-24 ENCOUNTER — Other Ambulatory Visit: Payer: Self-pay

## 2021-01-24 VITALS — BP 140/70 | HR 67 | Ht 60.0 in | Wt 121.0 lb

## 2021-01-24 DIAGNOSIS — E785 Hyperlipidemia, unspecified: Secondary | ICD-10-CM

## 2021-01-24 DIAGNOSIS — E119 Type 2 diabetes mellitus without complications: Secondary | ICD-10-CM | POA: Diagnosis not present

## 2021-01-24 DIAGNOSIS — Z951 Presence of aortocoronary bypass graft: Secondary | ICD-10-CM | POA: Insufficient documentation

## 2021-01-24 DIAGNOSIS — I25709 Atherosclerosis of coronary artery bypass graft(s), unspecified, with unspecified angina pectoris: Secondary | ICD-10-CM

## 2021-01-24 DIAGNOSIS — I10 Essential (primary) hypertension: Secondary | ICD-10-CM

## 2021-01-24 DIAGNOSIS — E1169 Type 2 diabetes mellitus with other specified complication: Secondary | ICD-10-CM | POA: Diagnosis not present

## 2021-01-24 HISTORY — DX: Presence of aortocoronary bypass graft: Z95.1

## 2021-01-24 NOTE — Patient Instructions (Signed)
Medication Instructions:  No medication changes. *If you need a refill on your cardiac medications before your next appointment, please call your pharmacy*   Lab Work: None ordered If you have labs (blood work) drawn today and your tests are completely normal, you will receive your results only by: MyChart Message (if you have MyChart) OR A paper copy in the mail If you have any lab test that is abnormal or we need to change your treatment, we will call you to review the results.   Testing/Procedures: Your physician has requested that you have an echocardiogram. Echocardiography is a painless test that uses sound waves to create images of your heart. It provides your doctor with information about the size and shape of your heart and how well your heart's chambers and valves are working. This procedure takes approximately one hour. There are no restrictions for this procedure.    Follow-Up: At [facility], you and your health needs are our priority.  As part of our continuing mission to provide you with exceptional heart care, we have created designated Provider Care Teams.  These Care Teams include your primary Cardiologist (physician) and Advanced Practice Providers (APPs -  Physician Assistants and Nurse Practitioners) who all work together to provide you with the care you need, when you need it.  We recommend signing up for the patient portal called "MyChart".  Sign up information is provided on this After Visit Summary.  MyChart is used to connect with patients for Virtual Visits (Telemedicine).  Patients are able to view lab/test results, encounter notes, upcoming appointments, etc.  Non-urgent messages can be sent to your provider as well.   To learn more about what you can do with MyChart, go to [email or web address].[email or web address].    Your next appointment:   6 month(s)  The format for your next appointment:   In Person  Provider:   Rajan Revankar, MD   Other  Instructions Echocardiogram An echocardiogram is a test that uses sound waves (ultrasound) to produce images of the heart. Images from an echocardiogram can provide important information about: Heart size and shape. The size and thickness and movement of your heart's walls. Heart muscle function and strength. Heart valve function or if you have stenosis. Stenosis is when the heart valves are too narrow. If blood is flowing backward through the heart valves (regurgitation). A tumor or infectious growth around the heart valves. Areas of heart muscle that are not working well because of poor blood flow or injury from a heart attack. Aneurysm detection. An aneurysm is a weak or damaged part of an artery wall. The wall bulges out from the normal force of blood pumping through the body. Tell a health care provider about: Any allergies you have. All medicines you are taking, including vitamins, herbs, eye drops, creams, and over-the-counter medicines. Any blood disorders you have. Any surgeries you have had. Any medical conditions you have. Whether you are pregnant or may be pregnant. What are the risks? Generally, this is a safe test. However, problems may occur, including an allergic reaction to dye (contrast) that may be used during the test. What happens before the test? No specific preparation is needed. You may eat and drink normally. What happens during the test? You will take off your clothes from the waist up and put on a hospital gown. Electrodes or electrocardiogram (ECG)patches may be placed on your chest. The electrodes or patches are then connected to a device that monitors your heart rate and rhythm. You will   lie down on a table for an ultrasound exam. A gel will be applied to your chest to help sound waves pass through your skin. A handheld device, called a transducer, will be pressed against your chest and moved over your heart. The transducer produces sound waves that travel to  your heart and bounce back (or "echo" back) to the transducer. These sound waves will be captured in real-time and changed into images of your heart that can be viewed on a video monitor. The images will be recorded on a computer and reviewed by your health care provider. You may be asked to change positions or hold your breath for a short time. This makes it easier to get different views or better views of your heart. In some cases, you may receive contrast through an IV in one of your veins. This can improve the quality of the pictures from your heart. The procedure may vary among health care providers and hospitals.   What can I expect after the test? You may return to your normal, everyday life, including diet, activities, and medicines, unless your health care provider tells you not to do that. Follow these instructions at home: It is up to you to get the results of your test. Ask your health care provider, or the department that is doing the test, when your results will be ready. Keep all follow-up visits. This is important. Summary An echocardiogram is a test that uses sound waves (ultrasound) to produce images of the heart. Images from an echocardiogram can provide important information about the size and shape of your heart, heart muscle function, heart valve function, and other possible heart problems. You do not need to do anything to prepare before this test. You may eat and drink normally. After the echocardiogram is completed, you may return to your normal, everyday life, unless your health care provider tells you not to do that. This information is not intended to replace advice given to you by your health care provider. Make sure you discuss any questions you have with your health care provider. Document Revised: 03/05/2020 Document Reviewed: 03/05/2020 Elsevier Patient Education  2021 Elsevier Inc.   

## 2021-01-24 NOTE — Progress Notes (Signed)
Cardiology Office Note:    Date:  01/24/2021   ID:  Pamela Barnett, DOB [birth year], MRN [id number]  PCP:  Overton Mam, DO  Cardiologist:  Garwin Brothers, MD   Referring MD: Overton Mam, DO    ASSESSMENT:    1. Primary hypertension   2. Diabetes mellitus without complication (HCC)   3. Hyperlipidemia associated with type 2 diabetes mellitus (HCC)   4. Coronary artery disease involving coronary bypass graft of native heart with angina pectoris (HCC)    PLAN:    In order of problems listed above:  Coronary artery disease: Secondary prevention stressed with the patient.  Importance of compliance with diet medication stressed and she vocalized understanding. Essential hypertension: Blood pressure stable and diet was emphasized.  Lifestyle modification urged. Dyslipidemia and diabetes mellitus: Diet was emphasized.  Lipids reviewed.  Hemoglobin A1c is elevated and I told her to be evaluated by a dietitian to adjust her diet so that her hemoglobin A1c can be optimized. She does have nitroglycerin and also uses aspirin on a regular basis.  81 mg coated. Cardiac murmur: Echocardiogram will be done to assess murmur heard on auscultation. Patient will be seen in follow-up appointment in 6 months or earlier if the patient has any concerns    Medication Adjustments/Labs and Tests Ordered: Current medicines are reviewed at length with the patient today.  Concerns regarding medicines are outlined above.  Orders Placed This Encounter  Procedures   EKG 12-Lead   ECHOCARDIOGRAM COMPLETE   No orders of the defined types were placed in this encounter.    History of Present Illness:    Pamela Barnett is a 87 y.o. female who is being seen today for the evaluation of coronary artery disease post CABG to be established at the request of Overton Mam, DO.  Patient has past medical history of coronary artery disease post CABG surgery, essential hypertension, mixed  dyslipidemia and diabetes mellitus.  She denies any problems at this time and takes care of activities of daily living.  No chest pain orthopnea or PND.  At the time of my evaluation, the patient is alert awake oriented and in no distress.  She ambulates with walker.  She uses aspirin on a daily basis and also has nitroglycerin.  Past Medical History:  Diagnosis Date   Diabetes mellitus without complication (HCC)    Hyperlipidemia    Hyperlipidemia associated with type 2 diabetes mellitus (HCC) 12/20/2020   Hypertension    Normocytic anemia 01/20/2021   Primary hypertension 12/20/2020   Type 2 diabetes mellitus without complication, without long-term current use of insulin (HCC) 12/20/2020    Past Surgical History:  Procedure Laterality Date   cataracts surgery  2011   EYE SURGERY     VEIN BYPASS SURGERY  1998    Current Medications: Current Meds  Medication Sig   amLODipine (NORVASC) 5 MG tablet Take 1 tablet (5 mg total) by mouth in the morning and at bedtime.   Ascorbic Acid (VITAMIN C) 100 MG tablet Take 100 mg by mouth daily.   atorvastatin (LIPITOR) 10 MG tablet Take 1 tablet (10 mg total) by mouth daily.   Cholecalciferol (VITAMIN D3) 125 MCG (5000 UT) CAPS Take 5,000 Units by mouth daily.   hydrochlorothiazide (MICROZIDE) 12.5 MG capsule Take 1 capsule (12.5 mg total) by mouth daily.   irbesartan (AVAPRO) 300 MG tablet Take 1 tablet (300 mg total) by mouth daily.   isosorbide mononitrate (IMDUR) 30 MG 24 hr tablet  Take 1 tablet (30 mg total) by mouth daily.   Krill Oil 350 MG CAPS Take 350 mg by mouth daily.   metFORMIN (GLUCOPHAGE) 1000 MG tablet Take 1 tablet (1,000 mg total) by mouth 2 (two) times daily with a meal.   Multiple Vitamins-Minerals (IMMUNE SUPPORT VITAMIN C) PACK Take 1 tablet by mouth daily. Airborne   nitroGLYCERIN (NITROSTAT) 0.4 MG SL tablet Place 1 tablet (0.4 mg total) under the tongue every 5 (five) minutes as needed for chest pain.     Allergies:    Penicillins   Social History   Socioeconomic History   Marital status: Widowed    Spouse name: Not on file   Number of children: Not on file   Years of education: Not on file   Highest education level: Not on file  Occupational History   Not on file  Tobacco Use   Smoking status: Never   Smokeless tobacco: Never  Substance and Sexual Activity   Alcohol use: Never   Drug use: Never   Sexual activity: Not on file  Other Topics Concern   Not on file  Social History Narrative   Not on file   Social Determinants of Health   Financial Resource Strain: Not on file  Food Insecurity: Not on file  Transportation Needs: Not on file  Physical Activity: Not on file  Stress: Not on file  Social Connections: Not on file     Family History: The patient's family history includes Diabetes in her daughter; Myocarditis in her father.  ROS:   Please see the history of present illness.    All other systems reviewed and are negative.  EKGs/Labs/Other Studies Reviewed:    The following studies were reviewed today: EKG reveals sinus rhythm and nonspecific ST-T changes   Recent Labs: 12/20/2020: ALT 13; BUN 18; Creatinine, Ser 0.80; Potassium 4.5; Sodium 139 01/20/2021: Hemoglobin 10.6; Platelets 284.0; TSH 3.16  Recent Lipid Panel    Component Value Date/Time   CHOL 153 12/20/2020 1108   TRIG 111.0 12/20/2020 1108   HDL 59.80 12/20/2020 1108   CHOLHDL 3 12/20/2020 1108   VLDL 22.2 12/20/2020 1108   LDLCALC 71 12/20/2020 1108    Physical Exam:    VS:  BP 140/70   Pulse 67   Ht 5' (1.524 m)   Wt 121 lb (54.9 kg)   SpO2 99%   BMI 23.63 kg/m     Wt Readings from Last 3 Encounters:  01/24/21 121 lb (54.9 kg)  01/20/21 122 lb 12.8 oz (55.7 kg)  12/20/20 124 lb 6.4 oz (56.4 kg)     GEN: Patient is in no acute distress HEENT: Normal NECK: No JVD; No carotid bruits LYMPHATICS: No lymphadenopathy CARDIAC: S1 S2 regular, 2/6 systolic murmur at the apex. RESPIRATORY:  Clear  to auscultation without rales, wheezing or rhonchi  ABDOMEN: Soft, non-tender, non-distended MUSCULOSKELETAL:  No edema; No deformity  SKIN: Warm and dry NEUROLOGIC:  Alert and oriented x 3 PSYCHIATRIC:  Normal affect    Signed, Garwin Brothers, MD  01/24/2021 9:21 AM    [facility]

## 2021-02-01 ENCOUNTER — Other Ambulatory Visit: Payer: Self-pay | Admitting: Family Medicine

## 2021-02-01 ENCOUNTER — Encounter: Payer: Self-pay | Admitting: Family Medicine

## 2021-02-01 DIAGNOSIS — E119 Type 2 diabetes mellitus without complications: Secondary | ICD-10-CM

## 2021-02-01 MED ORDER — DAPAGLIFLOZIN PROPANEDIOL 5 MG PO TABS: 5.0000 mg | ORAL_TABLET | Freq: Every day | ORAL | 1 refills | Status: DC

## 2021-02-06 ENCOUNTER — Telehealth (INDEPENDENT_AMBULATORY_CARE_PROVIDER_SITE_OTHER): Payer: Medicare Other | Admitting: Family Medicine

## 2021-02-06 ENCOUNTER — Encounter: Payer: Self-pay | Admitting: Family Medicine

## 2021-02-06 VITALS — BP 140/70 | HR 80 | Wt 121.0 lb

## 2021-02-06 DIAGNOSIS — E1169 Type 2 diabetes mellitus with other specified complication: Secondary | ICD-10-CM

## 2021-02-06 DIAGNOSIS — I10 Essential (primary) hypertension: Secondary | ICD-10-CM | POA: Diagnosis not present

## 2021-02-06 DIAGNOSIS — E119 Type 2 diabetes mellitus without complications: Secondary | ICD-10-CM

## 2021-02-06 DIAGNOSIS — R2689 Other abnormalities of gait and mobility: Secondary | ICD-10-CM | POA: Diagnosis not present

## 2021-02-06 DIAGNOSIS — R262 Difficulty in walking, not elsewhere classified: Secondary | ICD-10-CM

## 2021-02-06 DIAGNOSIS — E785 Hyperlipidemia, unspecified: Secondary | ICD-10-CM | POA: Diagnosis not present

## 2021-02-06 NOTE — Patient Instructions (Signed)
Dr. Veto Kemps  Dr. Hillard Danker Dr. Cheryll Cockayne

## 2021-02-06 NOTE — Progress Notes (Signed)
Virtual Visit via Video Note  I connected with Pamela Barnett on 02/06/21 at  9:00 AM EDT by a video enabled telemedicine application and verified that I am speaking with the correct person using two identifiers. Location patient: home Location provider: work Persons participating in the virtual visit: patient, provider  I discussed the limitations of evaluation and management by telemedicine and the availability of in person appointments. The patient expressed understanding and agreed to proceed.  Chief Complaint  Patient presents with   Referral    Pt would like home health service.    HPI: Pamela Barnett is a 87 y.o. female patient seen along with her daughter to discuss referral for home health services.  Pt has a PMHx significant for DM, HTN, HLD.  Daughter is primary care giver but now has to work in the office or travel at times.  She would like HH aide for a few hours per week. No falls. Has chair lift to go up/down stairs. Needs some help with showering. Needs help with getting outside in her wheelchair.  Pt is able to organize and take meds, daughter does check from time to time and would like someone who can check on this.   Past Medical History:  Diagnosis Date   Diabetes mellitus without complication (HCC)    Hyperlipidemia    Hyperlipidemia associated with type 2 diabetes mellitus (HCC) 12/20/2020   Hypertension    Normocytic anemia 01/20/2021   Primary hypertension 12/20/2020   Type 2 diabetes mellitus without complication, without long-term current use of insulin (HCC) 12/20/2020    Past Surgical History:  Procedure Laterality Date   cataracts surgery  2011   EYE SURGERY     VEIN BYPASS SURGERY  1998    Family History  Problem Relation Age of Onset   Myocarditis Father    Diabetes Daughter     Social History   Tobacco Use   Smoking status: Never   Smokeless tobacco: Never  Substance Use Topics   Alcohol use: Never   Drug use: Never      Current Outpatient Medications:    amLODipine (NORVASC) 5 MG tablet, Take 1 tablet (5 mg total) by mouth in the morning and at bedtime., Disp: 90 tablet, Rfl: 3   Ascorbic Acid (VITAMIN C) 100 MG tablet, Take 100 mg by mouth daily., Disp: , Rfl:    aspirin EC 81 MG tablet, Take 81 mg by mouth daily. Swallow whole., Disp: , Rfl:    atorvastatin (LIPITOR) 10 MG tablet, Take 1 tablet (10 mg total) by mouth daily., Disp: 90 tablet, Rfl: 3   Cholecalciferol (VITAMIN D3) 125 MCG (5000 UT) CAPS, Take 5,000 Units by mouth daily., Disp: , Rfl:    hydrochlorothiazide (MICROZIDE) 12.5 MG capsule, Take 1 capsule (12.5 mg total) by mouth daily., Disp: 90 capsule, Rfl: 3   irbesartan (AVAPRO) 300 MG tablet, Take 1 tablet (300 mg total) by mouth daily., Disp: 90 tablet, Rfl: 3   isosorbide mononitrate (IMDUR) 30 MG 24 hr tablet, Take 1 tablet (30 mg total) by mouth daily., Disp: 90 tablet, Rfl: 3   Krill Oil 350 MG CAPS, Take 350 mg by mouth daily., Disp: , Rfl:    metFORMIN (GLUCOPHAGE) 1000 MG tablet, Take 1 tablet (1,000 mg total) by mouth 2 (two) times daily with a meal., Disp: 180 tablet, Rfl: 3   Multiple Vitamins-Minerals (IMMUNE SUPPORT VITAMIN C) PACK, Take 1 tablet by mouth daily. Airborne, Disp: , Rfl:    nitroGLYCERIN (NITROSTAT) 0.4  MG SL tablet, Place 1 tablet (0.4 mg total) under the tongue every 5 (five) minutes as needed for chest pain., Disp: 15 tablet, Rfl: 2   dapagliflozin propanediol (FARXIGA) 5 MG TABS tablet, Take 1 tablet (5 mg total) by mouth daily before breakfast. (Patient not taking: Reported on 02/06/2021), Disp: 90 tablet, Rfl: 1  Allergies  Allergen Reactions   Penicillins       ROS: See pertinent positives and negatives per HPI.   EXAM:  VITALS per patient if applicable: BP 140/70 (BP Location: Left Arm, Patient Position: Sitting, Cuff Size: Normal)   Pulse 80   Wt 121 lb (54.9 kg)   BMI 23.63 kg/m   Wt Readings from Last 3 Encounters:  02/06/21 121 lb (54.9  kg)  01/24/21 121 lb (54.9 kg)  01/20/21 122 lb 12.8 oz (55.7 kg)   Temp Readings from Last 3 Encounters:  01/20/21 97.8 F (36.6 C) (Temporal)  12/20/20 (!) 97.3 F (36.3 C) (Temporal)   BP Readings from Last 3 Encounters:  02/06/21 140/70  01/24/21 140/70  01/20/21 130/82   Pulse Readings from Last 3 Encounters:  02/06/21 80  01/24/21 67  01/20/21 68     GENERAL: alert, oriented, appears well and in no acute distress  HEENT: atraumatic, conjunctiva clear, no obvious abnormalities on inspection of external nose and ears  NECK: normal movements of the head and neck  LUNGS: on inspection no signs of respiratory distress, breathing rate appears normal, no obvious gross SOB, gasping or wheezing, no conversational dyspnea  CV: no obvious cyanosis  PSYCH/NEURO: pleasant and cooperative, speech and thought processing grossly intact   ASSESSMENT AND PLAN:  1. Ambulatory dysfunction 2. Balance problem 3. Type 2 diabetes mellitus without complication, without long-term current use of insulin (HCC) 4. Primary hypertension 5. Hyperlipidemia associated with type 2 diabetes mellitus (HCC) - Ambulatory referral to Home Health - pt lives with daughter, but daughter works full time (no longer remote) and occasionally travels. Pt needs help with getting in/out of shower, organizing meds, getting outside in manual wheelchair   I discussed the assessment and treatment plan with the patient. The patient was provided an opportunity to ask questions and all were answered. The patient agreed with the plan and demonstrated an understanding of the instructions.   The patient was advised to call back or seek an in-person evaluation if the symptoms worsen or if the condition fails to improve as anticipated.   Luana Shu, DO

## 2021-02-12 ENCOUNTER — Ambulatory Visit (HOSPITAL_BASED_OUTPATIENT_CLINIC_OR_DEPARTMENT_OTHER)
Admission: RE | Admit: 2021-02-12 | Discharge: 2021-02-12 | Disposition: A | Discharge status: Home / Self Care | Payer: Medicare Other | Source: Ambulatory Visit | Attending: Cardiology | Admitting: Cardiology

## 2021-02-12 ENCOUNTER — Other Ambulatory Visit: Payer: Self-pay

## 2021-02-12 DIAGNOSIS — I25709 Atherosclerosis of coronary artery bypass graft(s), unspecified, with unspecified angina pectoris: Secondary | ICD-10-CM | POA: Insufficient documentation

## 2021-02-12 LAB — ECHOCARDIOGRAM COMPLETE
AR max vel: 1.12 cm2
AV Area VTI: 1.24 cm2
AV Area mean vel: 1.29 cm2
AV Mean grad: 7 mmHg
AV Peak grad: 16 mmHg
Ao pk vel: 2 m/s
Area-P 1/2: 3.6 cm2
Calc EF: 66 %
P 1/2 time: 424 msec
S' Lateral: 1.97 cm
Single Plane A2C EF: 78.7 %
Single Plane A4C EF: 49.9 %

## 2021-02-12 NOTE — Progress Notes (Signed)
*  PRELIMINARY RESULTS* Echocardiogram 2D Echocardiogram has been performed.  Neomia Dear RDCS 02/12/2021, 8:50 AM

## 2021-02-13 ENCOUNTER — Telehealth: Payer: Self-pay

## 2021-02-13 NOTE — Telephone Encounter (Signed)
-----   Message from Garwin Brothers, MD sent at 02/13/2021  8:52 AM EDT ----- The results of the study is unremarkable. Please inform patient. I will discuss in detail at next appointment. Cc  primary care/referring physician Garwin Brothers, MD 02/13/2021 8:52 AM

## 2021-02-13 NOTE — Telephone Encounter (Signed)
-----   Message from Rajan R Revankar, MD sent at 02/13/2021  8:52 AM EDT ----- The results of the study is unremarkable. Please inform patient. I will discuss in detail at next appointment. Cc  primary care/referring physician Rajan R Revankar, MD 02/13/2021 8:52 AM  

## 2021-02-13 NOTE — Telephone Encounter (Signed)
Left message on patients voicemail to please return our call.   

## 2021-02-13 NOTE — Telephone Encounter (Signed)
Spoke with patient and patients daughter regarding results and recommendation. ° °Patient verbalizes understanding and is agreeable to plan of care. Advised patient to call back with any issues or concerns.  °

## 2021-02-14 ENCOUNTER — Encounter: Payer: Self-pay | Admitting: Family Medicine

## 2021-02-18 ENCOUNTER — Other Ambulatory Visit: Payer: Self-pay | Admitting: Family Medicine

## 2021-02-28 NOTE — Telephone Encounter (Signed)
Im not sure what the authorization for September 2nd is, I haven't received anything about it. Do you know if this pt still needs home health aide

## 2021-02-28 NOTE — Telephone Encounter (Signed)
Lft VM to rtn call with [facility] @ [phone]. Dm/cma

## 2021-06-30 ENCOUNTER — Ambulatory Visit (INDEPENDENT_AMBULATORY_CARE_PROVIDER_SITE_OTHER): Payer: Medicare Other | Admitting: Internal Medicine

## 2021-06-30 ENCOUNTER — Other Ambulatory Visit: Payer: Self-pay

## 2021-06-30 ENCOUNTER — Encounter: Payer: Self-pay | Admitting: Internal Medicine

## 2021-06-30 VITALS — BP 126/68 | HR 73 | Resp 18 | Ht 60.0 in | Wt 128.0 lb

## 2021-06-30 DIAGNOSIS — E785 Hyperlipidemia, unspecified: Secondary | ICD-10-CM

## 2021-06-30 DIAGNOSIS — E1169 Type 2 diabetes mellitus with other specified complication: Secondary | ICD-10-CM

## 2021-06-30 DIAGNOSIS — D649 Anemia, unspecified: Secondary | ICD-10-CM

## 2021-06-30 DIAGNOSIS — Z951 Presence of aortocoronary bypass graft: Secondary | ICD-10-CM

## 2021-06-30 DIAGNOSIS — M25561 Pain in right knee: Secondary | ICD-10-CM

## 2021-06-30 DIAGNOSIS — E118 Type 2 diabetes mellitus with unspecified complications: Secondary | ICD-10-CM | POA: Diagnosis not present

## 2021-06-30 DIAGNOSIS — G8929 Other chronic pain: Secondary | ICD-10-CM

## 2021-06-30 DIAGNOSIS — M25562 Pain in left knee: Secondary | ICD-10-CM

## 2021-06-30 DIAGNOSIS — I10 Essential (primary) hypertension: Secondary | ICD-10-CM | POA: Diagnosis not present

## 2021-06-30 LAB — CBC
HCT: 31.2 % — ABNORMAL LOW (ref 36.0–46.0)
Hemoglobin: 9.8 g/dL — ABNORMAL LOW (ref 12.0–15.0)
MCHC: 31.6 g/dL (ref 30.0–36.0)
MCV: 88.6 fl (ref 78.0–100.0)
Platelets: 267 10*3/uL (ref 150.0–400.0)
RBC: 3.52 Mil/uL — ABNORMAL LOW (ref 3.87–5.11)
RDW: 14 % (ref 11.5–15.5)
WBC: 7.4 10*3/uL (ref 4.0–10.5)

## 2021-06-30 LAB — HEMOGLOBIN A1C: Hgb A1c MFr Bld: 8 % — ABNORMAL HIGH (ref 4.6–6.5)

## 2021-06-30 NOTE — Progress Notes (Signed)
Subjective:    CC: B knee pain  I, Molly Weber, LAT, ATC, am serving as scribe for Dr. [name].  HPI: Pt is a 87 y/o female presenting w/ c/o B knee pain x 30+ years.  She locates her pain to her B ant knees w/ radiating pain into her lower legs.  Pt fell a few weeks ago after her knees buckled and landed on her buttocks.  Patient has had trouble ambulating and more falls recently.  Knee swelling: no  Knee mechanical symptoms: no Aggravating factors: weight-bearing activity for longer than 5 min; sit-to-stand transitions Treatments tried: prior knee steroid injections in 2020; topical cream from [place]  Pertinent review of Systems: No fevers or chills  Relevant historical information: Diabetes   Objective:    Vitals:   07/01/21 1111  BP: 120/60  Pulse: 74  SpO2: 96%   General: Well Developed, well nourished, and in no acute distress.   MSK:  Right knee: Mild joint effusion.  Normal-appearing otherwise. Decreased range of motion with crepitation. Tender palpation medial joint line. Decreased strength to extension.  Left knee: Mild joint effusion. Normal-appearing otherwise. Decreased range of motion with crepitation. Tender palpation medial joint line. Decreased strength to extension.   Lab and Radiology Results  Procedure: Real-time Ultrasound Guided Injection of left knee superior lateral patellar space Device: Philips Affiniti 50G Images permanently stored and available for review in PACS Verbal informed consent obtained.  Discussed risks and benefits of procedure. Warned about infection bleeding damage to structures skin hypopigmentation and fat atrophy among others. Patient expresses understanding and agreement Time-out conducted.   Noted no overlying erythema, induration, or other signs of local infection.   Skin prepped in a sterile fashion.   Local anesthesia: Topical Ethyl chloride.   With sterile technique and under real time ultrasound  guidance: 40 mg of Kenalog and 2 mL of Marcaine injected into knee joint. Fluid seen entering the joint capsule.   Completed without difficulty   Pain immediately resolved suggesting accurate placement of the medication.   Advised to call if fevers/chills, erythema, induration, drainage, or persistent bleeding.   Images permanently stored and available for review in the ultrasound unit.  Impression: Technically successful ultrasound guided injection.  X-ray images bilateral knees obtained today personally and independently interpreted  Right knee: Mild medial compartment DJD.  Severe patellofemoral DJD.  No acute fractures.  Left knee: Mild medial and lateral DJD.  Moderate patellofemoral DJD. No acute fractures.  Await formal radiology review  Lab Results  Component Value Date   HGBA1C 8.0 (H) 06/30/2021      Impression and Recommendations:    Assessment and Plan: 87 y.o. female with bilateral knee pain due to DJD exacerbation. Plan for steroid injection starting with the left knee today.  Voltaren gel should also be helpful.  Tylenol arthritis is safe and should be helpful also. Will try to avoid more than 1 injection at a time given her diabetes with an A1c of 8.0. She can return as soon as 1 week from now for a contralateral right knee steroid injection.  Certainly the steroid injections do not last more than 3 months can proceed with hyaluronic acid injections.  However she will significantly benefit from physical therapy..  This will help both the knee pain as well as improve her gait and fall prevention as well as her overall functional status.  Her daughter goes to [facility] so that is where we will refer her.   Recheck  in about 6 weeks.   PDMP not reviewed this encounter. Orders Placed This Encounter  Procedures   [place] LIMITED JOINT SPACE STRUCTURES LOW BILAT(NO LINKED CHARGES)    Order Specific Question:   Reason for Exam (SYMPTOM  OR DIAGNOSIS REQUIRED)    Answer:    B knee pain    Order Specific Question:   Preferred imaging location?    Answer:   [facility] Knee AP/LAT W/Sunrise Left    Standing Status:   Future    Number of Occurrences:   1    Standing Expiration Date:   08/01/2021    Order Specific Question:   Reason for Exam (SYMPTOM  OR DIAGNOSIS REQUIRED)    Answer:   L knee pain    Order Specific Question:   Preferred imaging location?    Answer:   [facility] Knee AP/LAT W/Sunrise Right    Standing Status:   Future    Number of Occurrences:   1    Standing Expiration Date:   08/01/2021    Order Specific Question:   Reason for Exam (SYMPTOM  OR DIAGNOSIS REQUIRED)    Answer:   R knee pain    Order Specific Question:   Preferred imaging location?    Answer:   [facility]   Ambulatory referral to Physical Therapy    Referral Priority:   Routine    Referral Type:   Physical Medicine    Referral Reason:   Specialty Services Required    Requested Specialty:   Physical Therapy    Number of Visits Requested:   1   No orders of the defined types were placed in this encounter.   Discussed warning signs or symptoms. Please see discharge instructions. Patient expresses understanding.   The above documentation has been reviewed and is accurate and complete [name], M.D.

## 2021-06-30 NOTE — Patient Instructions (Addendum)
Tylenol is safe to take.   We will get you in with the eye doctor and the periodontist.   We will get you in with sports medicine downstairs to check out the knees.

## 2021-06-30 NOTE — Progress Notes (Signed)
   Subjective:   Patient ID: Pamela Barnett, female    DOB: 05/15/1933, 87 y.o.   MRN: [id number]  HPI The patient is a transfer of care 87 YO female coming in for worsening knee pain. Chronic knee pain and fall 2-3 weeks ago due to knee instability which has caused severe pain since then. They would like to work on her mobility. Patient's daughter helps to provide history.   Review of Systems  Constitutional: Negative.   HENT: Negative.    Eyes: Negative.   Respiratory:  Negative for cough, chest tightness and shortness of breath.   Cardiovascular:  Negative for chest pain, palpitations and leg swelling.  Gastrointestinal:  Negative for abdominal distention, abdominal pain, constipation, diarrhea, nausea and vomiting.  Musculoskeletal:  Positive for arthralgias and myalgias.  Skin: Negative.   Neurological: Negative.   Psychiatric/Behavioral: Negative.     Objective:  Physical Exam Constitutional:      Appearance: She is well-developed.  HENT:     Head: Normocephalic and atraumatic.  Cardiovascular:     Rate and Rhythm: Normal rate and regular rhythm.  Pulmonary:     Effort: Pulmonary effort is normal. No respiratory distress.     Breath sounds: Normal breath sounds. No wheezing or rales.  Abdominal:     General: Bowel sounds are normal. There is no distension.     Palpations: Abdomen is soft.     Tenderness: There is no abdominal tenderness. There is no rebound.  Musculoskeletal:        General: Tenderness present.     Cervical back: Normal range of motion.  Skin:    General: Skin is warm and dry.  Neurological:     Mental Status: She is alert and oriented to person, place, and time.     Coordination: Coordination abnormal.    Vitals:   06/30/21 1519  BP: 126/68  Pulse: 73  Resp: 18  SpO2: 99%  Weight: 128 lb (58.1 kg)  Height: 5' (1.524 m)    This visit occurred during the SARS-CoV-2 public health emergency.  Safety protocols were in place, including  screening questions prior to the visit, additional usage of staff PPE, and extensive cleaning of exam room while observing appropriate contact time as indicated for disinfecting solutions.   Assessment & Plan:  Visit time 35 minutes in face to face communication with patient and coordination of care, additional 15 minutes spent in record review, coordination or care, ordering tests, communicating/referring to other healthcare professionals, documenting in medical records all on the same day of the visit for total time 50 minutes spent on the visit.

## 2021-07-01 ENCOUNTER — Ambulatory Visit: Payer: Self-pay

## 2021-07-01 ENCOUNTER — Encounter: Payer: Self-pay | Admitting: Family Medicine

## 2021-07-01 ENCOUNTER — Ambulatory Visit: Payer: Medicare Other

## 2021-07-01 ENCOUNTER — Ambulatory Visit (INDEPENDENT_AMBULATORY_CARE_PROVIDER_SITE_OTHER): Payer: Medicare Other

## 2021-07-01 ENCOUNTER — Ambulatory Visit (INDEPENDENT_AMBULATORY_CARE_PROVIDER_SITE_OTHER): Payer: Medicare Other | Admitting: Family Medicine

## 2021-07-01 VITALS — BP 120/60 | HR 74 | Ht 60.0 in | Wt 128.0 lb

## 2021-07-01 DIAGNOSIS — M25562 Pain in left knee: Secondary | ICD-10-CM

## 2021-07-01 DIAGNOSIS — M170 Bilateral primary osteoarthritis of knee: Secondary | ICD-10-CM

## 2021-07-01 DIAGNOSIS — G8929 Other chronic pain: Secondary | ICD-10-CM

## 2021-07-01 DIAGNOSIS — M25561 Pain in right knee: Secondary | ICD-10-CM | POA: Diagnosis not present

## 2021-07-01 DIAGNOSIS — R269 Unspecified abnormalities of gait and mobility: Secondary | ICD-10-CM

## 2021-07-01 LAB — COMPREHENSIVE METABOLIC PANEL
ALT: 21 U/L (ref 0–35)
AST: 20 U/L (ref 0–37)
Albumin: 4.2 g/dL (ref 3.5–5.2)
Alkaline Phosphatase: 60 U/L (ref 39–117)
BUN: 20 mg/dL (ref 6–23)
CO2: 27 mEq/L (ref 19–32)
Calcium: 9.5 mg/dL (ref 8.4–10.5)
Chloride: 101 mEq/L (ref 96–112)
Creatinine, Ser: 0.79 mg/dL (ref 0.40–1.20)
GFR: 67.63 mL/min (ref >60.00)
Glucose, Bld: 86 mg/dL (ref 70–99)
Potassium: 4.3 mEq/L (ref 3.5–5.1)
Sodium: 138 mEq/L (ref 135–145)
Total Bilirubin: 0.3 mg/dL (ref 0.2–1.2)
Total Protein: 7 g/dL (ref 6.0–8.3)

## 2021-07-01 LAB — LIPID PANEL
Cholesterol: 152 mg/dL (ref 0–200)
HDL: 61.8 mg/dL (ref >39.00)
LDL Cholesterol: 67 mg/dL (ref 0–99)
NonHDL: 89.75
Total CHOL/HDL Ratio: 2
Triglycerides: 115 mg/dL (ref 0.0–149.0)
VLDL: 23 mg/dL (ref 0.0–40.0)

## 2021-07-01 NOTE — Patient Instructions (Addendum)
Nice to meet you today.  You had a L knee injection.  Call or go to the ER if you develop a large red swollen joint with extreme pain or oozing puss.   Please use Voltaren gel (Generic Diclofenac Gel) up to 4x daily for pain as needed.  This is available over-the-counter as both the name brand Voltaren gel and the generic diclofenac gel.   Please get an Xray today before you leave.  Follow-up: in as soon as 1 week for a R knee injection and in 6 weeks for B knee follow-up

## 2021-07-02 DIAGNOSIS — G8929 Other chronic pain: Secondary | ICD-10-CM

## 2021-07-02 DIAGNOSIS — M25562 Pain in left knee: Secondary | ICD-10-CM | POA: Insufficient documentation

## 2021-07-02 HISTORY — DX: Other chronic pain: G89.29

## 2021-07-02 NOTE — Assessment & Plan Note (Signed)
Checking CBC and adjust as needed.  

## 2021-07-02 NOTE — Progress Notes (Signed)
Right knee x-ray shows mild arthritis.

## 2021-07-02 NOTE — Assessment & Plan Note (Signed)
Taking lipitor 10 mg daily. Checking lipid panel and adjust dosing as needed.

## 2021-07-02 NOTE — Assessment & Plan Note (Signed)
Taking statin and ARB and imdur. No current chest pain but is taking long acting nitrate. Does take aspirin 81 mg daily. No signs of bleeding.

## 2021-07-02 NOTE — Assessment & Plan Note (Signed)
Taking hctz 12.5 mg daily and irbesartan 300 mg daily and BP at goal. Checking CMP and adjust as needed.

## 2021-07-02 NOTE — Assessment & Plan Note (Signed)
Referral to sports medicine for assessment for torn tendon and possible injection/pt.

## 2021-07-02 NOTE — Progress Notes (Signed)
Left knee x-ray shows mild arthritis.

## 2021-07-02 NOTE — Assessment & Plan Note (Addendum)
Foot exam done. She is taking metformin 1000 mg BID. Checking HgA1c and lipid panel and adjust as needed. She is on ARB and statin. Referral to eye doctor and periodontist.

## 2021-07-03 ENCOUNTER — Encounter: Payer: Self-pay | Admitting: Family Medicine

## 2021-07-03 ENCOUNTER — Other Ambulatory Visit: Payer: Self-pay | Admitting: Physical Therapy

## 2021-07-03 DIAGNOSIS — M25562 Pain in left knee: Secondary | ICD-10-CM

## 2021-07-03 DIAGNOSIS — G8929 Other chronic pain: Secondary | ICD-10-CM

## 2021-07-03 DIAGNOSIS — M170 Bilateral primary osteoarthritis of knee: Secondary | ICD-10-CM

## 2021-07-08 ENCOUNTER — Other Ambulatory Visit: Payer: Self-pay | Admitting: Physical Therapy

## 2021-07-08 DIAGNOSIS — M170 Bilateral primary osteoarthritis of knee: Secondary | ICD-10-CM

## 2021-07-08 DIAGNOSIS — G8929 Other chronic pain: Secondary | ICD-10-CM

## 2021-07-15 NOTE — Progress Notes (Signed)
I, Pamela Barnett, LAT, ATC acting as a scribe for Pamela Graham, MD.  Pamela Barnett is a 87 y.o. female who presents to [facility] at [facility] today for f/u bilat knee pain due to DJD exacerbation. Pt was last seen by Dr. Denyse Amass on 07/01/21 and was given a L knee steroid injection and was advised to use Tylenol and Voltaren gel. Pt was also referred to [facility] PT. Today, pt reports that she had 3 days of no pain in the left knee, but the right knee is hurting a lot.  She does not think the steroid injection helped very much for the right knee.  Physical therapy is scheduled to start very soon.  Dx imaging: 07/01/21 R & L knee XR  Pertinent review of systems: No fevers or chills  Relevant historical information: Diabetes   Exam:  BP 118/60    Pulse 72    Ht 5' (1.524 m)    SpO2 99%    BMI 25.00 kg/m  General: Well Developed, well nourished, and in no acute distress.   MSK: Right knee mild joint effusion.  Normal motion with crepitation.  Tender palpation medial joint line.    Lab and Radiology Results  Procedure: Real-time Ultrasound Guided Injection of right knee superior lateral patellar space Device: Philips Affiniti 50G Images permanently stored and available for review in PACS Verbal informed consent obtained.  Discussed risks and benefits of procedure. Warned about infection bleeding damage to structures skin hypopigmentation and fat atrophy among others. Patient expresses understanding and agreement Time-out conducted.   Noted no overlying erythema, induration, or other signs of local infection.   Skin prepped in a sterile fashion.   Local anesthesia: Topical Ethyl chloride.   With sterile technique and under real time ultrasound guidance: 40 mg of Kenalog and 2 mL of Marcaine injected into knee joint. Fluid seen entering the joint capsule.   Completed without difficulty   Pain immediately resolved suggesting accurate placement of the medication.    Advised to call if fevers/chills, erythema, induration, drainage, or persistent bleeding.   Images permanently stored and available for review in the ultrasound unit.  Impression: Technically successful ultrasound guided injection.  EXAM: RIGHT KNEE 3 VIEWS   COMPARISON:  None.   FINDINGS: There is no evidence for acute fracture or dislocation. There is no significant joint effusion. There is mild medial compartment joint space narrowing. There is medial, lateral and patellofemoral compartment osteophyte formation. There are surgical clips in the soft tissues of the medial knee.   IMPRESSION: 1. No acute bony abnormality. 2. Mild tricompartmental osteoarthrosis of the right knee.     Electronically Signed   By: Darliss Cheney    EXAM: LEFT KNEE 3 VIEWS   COMPARISON:  None.   FINDINGS: There is no acute fracture or dislocation. There is mild medial compartment joint space narrowing and osteophyte formation compatible with degenerative change. There are vascular calcifications in the tissues. The soft tissues are otherwise within normal limits.   IMPRESSION: 1. No acute bony abnormality. 2. Mild degenerative changes.     Electronically Signed   By: Darliss Cheney M.D.   On: 07/01/2021 23:41    I, Pamela Barnett, personally (independently) visualized and performed the interpretation of the images attached in this note.    Assessment and Plan: 87 y.o. female with bilateral knee pain thought to be due to DJD and basic quad weakness and deconditioning.  Plan for physical therapy.  Plan for right knee  steroid injection today.  We will work on hyaluronic acid injection authorization now in case this is not helpful.  Recheck back as needed.   PDMP not reviewed this encounter. Orders Placed This Encounter  Procedures   [place] LIMITED JOINT SPACE STRUCTURES LOW BILAT(NO LINKED CHARGES)    Standing Status:   Future    Number of Occurrences:   1    Standing Expiration Date:    01/13/2022    Order Specific Question:   Reason for Exam (SYMPTOM  OR DIAGNOSIS REQUIRED)    Answer:   bilateral knee pain    Order Specific Question:   Preferred imaging location?    Answer:   [facility]   No orders of the defined types were placed in this encounter.    Discussed warning signs or symptoms. Please see discharge instructions. Patient expresses understanding.   The above documentation has been reviewed and is accurate and complete Pamela Barnett, M.D.

## 2021-07-16 ENCOUNTER — Other Ambulatory Visit: Payer: Self-pay

## 2021-07-16 ENCOUNTER — Ambulatory Visit (INDEPENDENT_AMBULATORY_CARE_PROVIDER_SITE_OTHER): Payer: Medicare Other | Admitting: Family Medicine

## 2021-07-16 ENCOUNTER — Ambulatory Visit: Payer: Self-pay

## 2021-07-16 VITALS — BP 118/60 | HR 72 | Ht 60.0 in

## 2021-07-16 DIAGNOSIS — G8929 Other chronic pain: Secondary | ICD-10-CM | POA: Diagnosis not present

## 2021-07-16 DIAGNOSIS — M25561 Pain in right knee: Secondary | ICD-10-CM | POA: Diagnosis not present

## 2021-07-16 NOTE — Patient Instructions (Addendum)
Thank you for coming in today.   Proceed to physical therapy  We will work to get the gel shots authorized.  You will hear from my office about scheduling once we get approval through your insurance.

## 2021-07-29 DIAGNOSIS — G8929 Other chronic pain: Secondary | ICD-10-CM | POA: Diagnosis not present

## 2021-07-29 DIAGNOSIS — M25661 Stiffness of right knee, not elsewhere classified: Secondary | ICD-10-CM | POA: Diagnosis not present

## 2021-07-29 DIAGNOSIS — R2689 Other abnormalities of gait and mobility: Secondary | ICD-10-CM | POA: Diagnosis not present

## 2021-07-29 DIAGNOSIS — M25562 Pain in left knee: Secondary | ICD-10-CM | POA: Diagnosis not present

## 2021-07-29 DIAGNOSIS — M25662 Stiffness of left knee, not elsewhere classified: Secondary | ICD-10-CM | POA: Diagnosis not present

## 2021-07-29 DIAGNOSIS — M25561 Pain in right knee: Secondary | ICD-10-CM | POA: Diagnosis not present

## 2021-07-29 DIAGNOSIS — M170 Bilateral primary osteoarthritis of knee: Secondary | ICD-10-CM | POA: Diagnosis not present

## 2021-08-05 DIAGNOSIS — M25561 Pain in right knee: Secondary | ICD-10-CM | POA: Diagnosis not present

## 2021-08-05 DIAGNOSIS — R262 Difficulty in walking, not elsewhere classified: Secondary | ICD-10-CM | POA: Diagnosis not present

## 2021-08-05 DIAGNOSIS — G8929 Other chronic pain: Secondary | ICD-10-CM | POA: Diagnosis not present

## 2021-08-05 DIAGNOSIS — M170 Bilateral primary osteoarthritis of knee: Secondary | ICD-10-CM | POA: Diagnosis not present

## 2021-08-05 DIAGNOSIS — M25562 Pain in left knee: Secondary | ICD-10-CM | POA: Diagnosis not present

## 2021-08-05 DIAGNOSIS — M6289 Other specified disorders of muscle: Secondary | ICD-10-CM | POA: Diagnosis not present

## 2021-08-07 DIAGNOSIS — R262 Difficulty in walking, not elsewhere classified: Secondary | ICD-10-CM | POA: Diagnosis not present

## 2021-08-07 DIAGNOSIS — M6289 Other specified disorders of muscle: Secondary | ICD-10-CM | POA: Diagnosis not present

## 2021-08-07 DIAGNOSIS — G8929 Other chronic pain: Secondary | ICD-10-CM | POA: Diagnosis not present

## 2021-08-07 DIAGNOSIS — M25561 Pain in right knee: Secondary | ICD-10-CM | POA: Diagnosis not present

## 2021-08-07 DIAGNOSIS — M25562 Pain in left knee: Secondary | ICD-10-CM | POA: Diagnosis not present

## 2021-08-07 DIAGNOSIS — M170 Bilateral primary osteoarthritis of knee: Secondary | ICD-10-CM | POA: Diagnosis not present

## 2021-08-12 ENCOUNTER — Ambulatory Visit (INDEPENDENT_AMBULATORY_CARE_PROVIDER_SITE_OTHER): Payer: Medicare Other | Admitting: Family Medicine

## 2021-08-12 ENCOUNTER — Other Ambulatory Visit: Payer: Self-pay

## 2021-08-12 ENCOUNTER — Ambulatory Visit: Payer: Self-pay

## 2021-08-12 DIAGNOSIS — G8929 Other chronic pain: Secondary | ICD-10-CM

## 2021-08-12 DIAGNOSIS — M25561 Pain in right knee: Secondary | ICD-10-CM

## 2021-08-12 DIAGNOSIS — M6289 Other specified disorders of muscle: Secondary | ICD-10-CM | POA: Diagnosis not present

## 2021-08-12 DIAGNOSIS — M25562 Pain in left knee: Secondary | ICD-10-CM

## 2021-08-12 DIAGNOSIS — M170 Bilateral primary osteoarthritis of knee: Secondary | ICD-10-CM

## 2021-08-12 NOTE — Patient Instructions (Addendum)
Thank you for coming in today.   Glad you are doing well.  Continue physical therapy  If interested in pursuing the gel shots, give [place] a few days heads-up, so we can get the product in stock, and then please schedule a visit.

## 2021-08-12 NOTE — Progress Notes (Signed)
I, Philbert Riser, LAT, ATC acting as a scribe for [name], MD.  Tiffanni Scarfo is a 88 y.o. female who presents to [facility] at [facility] today for f/u bilat knee pain due to DJD . Pt was last seen by Dr. Denyse Amass on 07/16/21 and was given a R knee steroid injection and referred to PT. Authorization of Gelsyn3/Durolane was obtained for bilat knees and this was communicated to the pt via MyChart on 07/30/21. Pt has started PT at [facility] and is improving. Pt is happy w/ how well she is doing at this time and does not want to pursue the gel shots at this time.   Dx imaging: 07/01/21 R & L knee XR  Pertinent review of systems: No fevers or chills  Relevant historical information: Diabetes   Exam:  Heart Rate 80 General: Well Developed, well nourished, and in no acute distress.   MSK: Ambulating with the use of a walker.  Knees are mildly to palpation with motion with crepitation.    Lab and Radiology Results  EXAM: RIGHT KNEE 3 VIEWS   COMPARISON:  None.   FINDINGS: There is no evidence for acute fracture or dislocation. There is no significant joint effusion. There is mild medial compartment joint space narrowing. There is medial, lateral and patellofemoral compartment osteophyte formation. There are surgical clips in the soft tissues of the medial knee.   IMPRESSION: 1. No acute bony abnormality. 2. Mild tricompartmental osteoarthrosis of the right knee.     Electronically Signed   By: Darliss Cheney M.D.   On: 07/01/2021 23:44  EXAM: LEFT KNEE 3 VIEWS   COMPARISON:  None.   FINDINGS: There is no acute fracture or dislocation. There is mild medial compartment joint space narrowing and osteophyte formation compatible with degenerative change. There are vascular calcifications in the tissues. The soft tissues are otherwise within normal limits.   IMPRESSION: 1. No acute bony abnormality. 2. Mild degenerative changes.      Electronically Signed   By: Darliss Cheney M.D.   On: 07/01/2021 23:41   I, [name], personally (independently) visualized and performed the interpretation of the images attached in this note.      Assessment and Plan: 88 y.o. female with bilateral knee pain due to DJD.  Doing well with steroid injection in December and physical therapy.  Pain is controlled and she is improving her ability to ambulate with physical therapy.  We are ready to go for gel shots today Unk Lightning and Durolane both authorized) but since she is doing well we decided not to do gel shots. Continue exercise program and recheck back as needed. Total encounter time 20 minutes including face-to-face time with the patient and, reviewing past medical record, and charting on the date of service.   Discussion treatment plan and options going forward.   PDMP not reviewed this encounter. Orders Placed This Encounter  Procedures   [place] LIMITED JOINT SPACE STRUCTURES LOW BILAT(NO LINKED CHARGES)    Order Specific Question:   Reason for Exam (SYMPTOM  OR DIAGNOSIS REQUIRED)    Answer:   B knee pain    Order Specific Question:   Preferred imaging location?    Answer:   [facility]   No orders of the defined types were placed in this encounter.    Discussed warning signs or symptoms. Please see discharge instructions. Patient expresses understanding.   The above documentation has been reviewed and is accurate and complete Clayburn Pert  Georgina Snell, M.D.

## 2021-08-14 DIAGNOSIS — M6289 Other specified disorders of muscle: Secondary | ICD-10-CM | POA: Diagnosis not present

## 2021-08-14 DIAGNOSIS — M25562 Pain in left knee: Secondary | ICD-10-CM | POA: Diagnosis not present

## 2021-08-14 DIAGNOSIS — M25561 Pain in right knee: Secondary | ICD-10-CM | POA: Diagnosis not present

## 2021-08-14 DIAGNOSIS — M170 Bilateral primary osteoarthritis of knee: Secondary | ICD-10-CM | POA: Diagnosis not present

## 2021-08-14 DIAGNOSIS — G8929 Other chronic pain: Secondary | ICD-10-CM | POA: Diagnosis not present

## 2021-08-19 DIAGNOSIS — G8929 Other chronic pain: Secondary | ICD-10-CM | POA: Diagnosis not present

## 2021-08-19 DIAGNOSIS — M25561 Pain in right knee: Secondary | ICD-10-CM | POA: Diagnosis not present

## 2021-08-19 DIAGNOSIS — M170 Bilateral primary osteoarthritis of knee: Secondary | ICD-10-CM | POA: Diagnosis not present

## 2021-08-19 DIAGNOSIS — M6289 Other specified disorders of muscle: Secondary | ICD-10-CM | POA: Diagnosis not present

## 2021-08-19 DIAGNOSIS — R262 Difficulty in walking, not elsewhere classified: Secondary | ICD-10-CM | POA: Diagnosis not present

## 2021-08-19 DIAGNOSIS — M25562 Pain in left knee: Secondary | ICD-10-CM | POA: Diagnosis not present

## 2021-08-21 ENCOUNTER — Encounter: Payer: Self-pay | Admitting: Internal Medicine

## 2021-08-21 DIAGNOSIS — G8929 Other chronic pain: Secondary | ICD-10-CM | POA: Diagnosis not present

## 2021-08-21 DIAGNOSIS — M170 Bilateral primary osteoarthritis of knee: Secondary | ICD-10-CM | POA: Diagnosis not present

## 2021-08-21 DIAGNOSIS — M25561 Pain in right knee: Secondary | ICD-10-CM | POA: Diagnosis not present

## 2021-08-21 DIAGNOSIS — M25562 Pain in left knee: Secondary | ICD-10-CM | POA: Diagnosis not present

## 2021-08-21 DIAGNOSIS — M6289 Other specified disorders of muscle: Secondary | ICD-10-CM | POA: Diagnosis not present

## 2021-08-25 ENCOUNTER — Other Ambulatory Visit: Payer: Self-pay

## 2021-08-25 DIAGNOSIS — E785 Hyperlipidemia, unspecified: Secondary | ICD-10-CM

## 2021-08-25 DIAGNOSIS — E119 Type 2 diabetes mellitus without complications: Secondary | ICD-10-CM

## 2021-08-25 DIAGNOSIS — I10 Essential (primary) hypertension: Secondary | ICD-10-CM

## 2021-08-25 DIAGNOSIS — E1169 Type 2 diabetes mellitus with other specified complication: Secondary | ICD-10-CM

## 2021-08-25 MED ORDER — ACCU-CHEK FASTCLIX LANCETS MISC: 5 refills | Status: DC

## 2021-08-25 MED ORDER — METFORMIN HCL 1000 MG PO TABS: 1000.0000 mg | ORAL_TABLET | Freq: Two times a day (BID) | ORAL | 3 refills | Status: DC

## 2021-08-25 MED ORDER — ACCU-CHEK GUIDE VI STRP: 1.0000 | ORAL_STRIP | 5 refills | Status: DC

## 2021-08-25 MED ORDER — VITAMIN D3 125 MCG (5000 UT) PO CAPS: 5000.0000 [IU] | ORAL_CAPSULE | Freq: Every day | ORAL | 3 refills | Status: AC

## 2021-08-25 MED ORDER — ATORVASTATIN CALCIUM 10 MG PO TABS: 10.0000 mg | ORAL_TABLET | Freq: Every day | ORAL | 3 refills | Status: DC

## 2021-08-25 MED ORDER — HYDROCHLOROTHIAZIDE 12.5 MG PO CAPS: 12.5000 mg | ORAL_CAPSULE | Freq: Every day | ORAL | 3 refills | Status: DC

## 2021-08-25 MED ORDER — IRBESARTAN 300 MG PO TABS: 300.0000 mg | ORAL_TABLET | Freq: Every day | ORAL | 3 refills | Status: DC

## 2021-08-25 MED ORDER — AMLODIPINE BESYLATE 5 MG PO TABS: 5.0000 mg | ORAL_TABLET | Freq: Two times a day (BID) | ORAL | 3 refills | Status: DC

## 2021-08-25 MED ORDER — DAPAGLIFLOZIN PROPANEDIOL 5 MG PO TABS: 5.0000 mg | ORAL_TABLET | Freq: Every day | ORAL | 1 refills | Status: DC

## 2021-08-25 MED ORDER — ISOSORBIDE MONONITRATE ER 30 MG PO TB24: 30.0000 mg | ORAL_TABLET | Freq: Every day | ORAL | 3 refills | Status: DC

## 2021-08-26 DIAGNOSIS — M170 Bilateral primary osteoarthritis of knee: Secondary | ICD-10-CM | POA: Diagnosis not present

## 2021-08-26 DIAGNOSIS — M25561 Pain in right knee: Secondary | ICD-10-CM | POA: Diagnosis not present

## 2021-08-26 DIAGNOSIS — G8929 Other chronic pain: Secondary | ICD-10-CM | POA: Diagnosis not present

## 2021-08-26 DIAGNOSIS — M25562 Pain in left knee: Secondary | ICD-10-CM | POA: Diagnosis not present

## 2021-08-26 DIAGNOSIS — M6289 Other specified disorders of muscle: Secondary | ICD-10-CM | POA: Diagnosis not present

## 2021-08-28 DIAGNOSIS — R2689 Other abnormalities of gait and mobility: Secondary | ICD-10-CM | POA: Diagnosis not present

## 2021-08-28 DIAGNOSIS — Z9181 History of falling: Secondary | ICD-10-CM | POA: Diagnosis not present

## 2021-08-28 DIAGNOSIS — M6281 Muscle weakness (generalized): Secondary | ICD-10-CM | POA: Diagnosis not present

## 2021-08-28 DIAGNOSIS — M25561 Pain in right knee: Secondary | ICD-10-CM | POA: Diagnosis not present

## 2021-08-28 DIAGNOSIS — M25562 Pain in left knee: Secondary | ICD-10-CM | POA: Diagnosis not present

## 2021-08-28 DIAGNOSIS — M170 Bilateral primary osteoarthritis of knee: Secondary | ICD-10-CM | POA: Diagnosis not present

## 2021-09-20 ENCOUNTER — Encounter: Payer: Self-pay | Admitting: Internal Medicine

## 2021-09-24 ENCOUNTER — Encounter: Payer: Self-pay | Admitting: Cardiology

## 2021-09-27 ENCOUNTER — Ambulatory Visit (INDEPENDENT_AMBULATORY_CARE_PROVIDER_SITE_OTHER): Payer: Medicare Other

## 2021-09-27 ENCOUNTER — Ambulatory Visit (HOSPITAL_COMMUNITY): Payer: Medicare Other

## 2021-09-27 ENCOUNTER — Other Ambulatory Visit: Payer: Self-pay

## 2021-09-27 ENCOUNTER — Encounter (HOSPITAL_COMMUNITY): Payer: Self-pay | Admitting: Emergency Medicine

## 2021-09-27 ENCOUNTER — Ambulatory Visit (HOSPITAL_COMMUNITY)
Admission: EM | Admit: 2021-09-27 | Discharge: 2021-09-27 | Disposition: A | Discharge status: Home / Self Care | Payer: Medicare Other | Attending: Emergency Medicine | Admitting: Emergency Medicine

## 2021-09-27 DIAGNOSIS — R059 Cough, unspecified: Secondary | ICD-10-CM

## 2021-09-27 DIAGNOSIS — J310 Chronic rhinitis: Secondary | ICD-10-CM

## 2021-09-27 DIAGNOSIS — R0989 Other specified symptoms and signs involving the circulatory and respiratory systems: Secondary | ICD-10-CM

## 2021-09-27 DIAGNOSIS — J329 Chronic sinusitis, unspecified: Secondary | ICD-10-CM

## 2021-09-27 DIAGNOSIS — R0982 Postnasal drip: Secondary | ICD-10-CM

## 2021-09-27 DIAGNOSIS — R509 Fever, unspecified: Secondary | ICD-10-CM

## 2021-09-27 DIAGNOSIS — R051 Acute cough: Secondary | ICD-10-CM

## 2021-09-27 MED ORDER — FLUTICASONE PROPIONATE 50 MCG/ACT NA SUSP: 1.0000 | Freq: Every day | NASAL | 0 refills | Status: DC

## 2021-09-27 MED ORDER — AZITHROMYCIN 250 MG PO TABS: ORAL_TABLET | ORAL | 0 refills | Status: AC

## 2021-09-27 NOTE — ED Provider Notes (Signed)
[facility]    CSN: [id number] Arrival date & time: 09/27/21  1643    HISTORY   Chief Complaint  Patient presents with   Cough   Fever   Nasal Congestion   HPI Pamela Barnett is a 88 y.o. female. Patient is here with daughter today who reports patient has had nasal congestion and cough x 2 weeks and low grade fever (Tmax 100.0) x 3 days.  Daughter states that patient has gone through an entire bottle of diabetic Tussin with no relief.  Daughter states she is concerned that her mother may have pneumonia.  The history is provided by the patient.  Past Medical History:  Diagnosis Date   Diabetes mellitus without complication (HCC)    Hyperlipidemia    Hyperlipidemia associated with type 2 diabetes mellitus (HCC) 12/20/2020   Hypertension    Normocytic anemia 01/20/2021   Primary hypertension 12/20/2020   Type 2 diabetes mellitus without complication, without long-term current use of insulin (HCC) 12/20/2020   Patient Active Problem List   Diagnosis Date Noted   Chronic pain of both knees 07/02/2021   Hx of CABG 01/24/2021   Type 2 diabetes with complication (HCC) 01/23/2021   Normocytic anemia 01/20/2021   Hyperlipidemia associated with type 2 diabetes mellitus (HCC) 12/20/2020   Primary hypertension 12/20/2020   Past Surgical History:  Procedure Laterality Date   cataracts surgery  2011   EYE SURGERY     VEIN BYPASS SURGERY  1998   OB History   No obstetric history on file.    Home Medications    Prior to Admission medications   Medication Sig Start Date End Date Taking? Authorizing Provider  Accu-Chek FastClix Lancets MISC USE AS DIRECTED 08/25/21   Myrlene Broker, MD  ACCU-CHEK GUIDE test strip 1 each by Other route See admin instructions. 08/25/21   Myrlene Broker, MD  amLODipine (NORVASC) 5 MG tablet Take 1 tablet (5 mg total) by mouth in the morning and at bedtime. 08/25/21   Myrlene Broker, MD  Ascorbic Acid (VITAMIN C) 100  MG tablet Take 100 mg by mouth daily.    [provider]  aspirin EC 81 MG tablet Take 81 mg by mouth daily. Swallow whole.    [provider]  atorvastatin (LIPITOR) 10 MG tablet Take 1 tablet (10 mg total) by mouth daily. 08/25/21   Myrlene Broker, MD  Cholecalciferol (VITAMIN D3) 125 MCG (5000 UT) CAPS Take 1 capsule (5,000 Units total) by mouth daily. 08/25/21   Myrlene Broker, MD  dapagliflozin propanediol (FARXIGA) 5 MG TABS tablet Take 1 tablet (5 mg total) by mouth daily before breakfast. 08/25/21   Myrlene Broker, MD  hydrochlorothiazide (MICROZIDE) 12.5 MG capsule Take 1 capsule (12.5 mg total) by mouth daily. 08/25/21   Myrlene Broker, MD  irbesartan (AVAPRO) 300 MG tablet Take 1 tablet (300 mg total) by mouth daily. 08/25/21   Myrlene Broker, MD  isosorbide mononitrate (IMDUR) 30 MG 24 hr tablet Take 1 tablet (30 mg total) by mouth daily. 08/25/21   Myrlene Broker, MD  Krill Oil 350 MG CAPS Take 350 mg by mouth daily.    [provider]  metFORMIN (GLUCOPHAGE) 1000 MG tablet Take 1 tablet (1,000 mg total) by mouth 2 (two) times daily with a meal. 08/25/21   Myrlene Broker, MD  Multiple Vitamins-Minerals (IMMUNE SUPPORT VITAMIN C) PACK Take 1 tablet by mouth daily. Airborne    [provider]  nitroGLYCERIN (NITROSTAT) 0.4 MG SL tablet Place 1 tablet (0.4 mg total) under the tongue every 5 (five) minutes as needed for chest pain. 12/20/20   CiriglianoJearld Lesch, Mary K, DO   Family History Family History  Problem Relation Age of Onset   Myocarditis Father    Diabetes Daughter    Social History Social History   Tobacco Use   Smoking status: Never   Smokeless tobacco: Never  Substance Use Topics   Alcohol use: Never   Drug use: Never   Allergies   Penicillins  Review of Systems Review of Systems Pertinent findings noted in history of present illness.   Physical Exam Triage Vital Signs ED Triage Vitals   Enc Vitals Group     BP 05/23/21 0827 (!) 147/82     Pulse Rate 05/23/21 0827 72     Resp 05/23/21 0827 18     Temp 05/23/21 0827 98.3 F (36.8 C)     Temp Source 05/23/21 0827 Oral     SpO2 05/23/21 0827 98 %     Weight --      Height --      Head Circumference --      Peak Flow --      Pain Score 05/23/21 0826 5     Pain Loc --      Pain Edu? --      Excl. in GC? --   No data found.  Updated Vital Signs BP (!) 130/59 (BP Location: Left Arm)    Pulse 82    Temp 98 F (36.7 C) (Oral)    Resp 16    Ht 5' (1.524 m)    Wt 128 lb 1.4 oz (58.1 kg)    SpO2 96%    BMI 25.02 kg/m   Physical Exam Vitals and nursing note reviewed.  Constitutional:      General: She is not in acute distress.    Appearance: Normal appearance. She is not ill-appearing.  HENT:     Head: Normocephalic and atraumatic.     Salivary Glands: Right salivary gland is not diffusely enlarged or tender. Left salivary gland is not diffusely enlarged or tender.     Right Ear: Ear canal and external ear normal. No drainage. A middle ear effusion is present. There is no impacted cerumen. Tympanic membrane is bulging. Tympanic membrane is not injected or erythematous.     Left Ear: Ear canal and external ear normal. No drainage. A middle ear effusion is present. There is no impacted cerumen. Tympanic membrane is bulging. Tympanic membrane is not injected or erythematous.     Ears:     Comments: Bilateral EACs normal, both TMs bulging with clear fluid    Nose: Rhinorrhea present. No nasal deformity, septal deviation, signs of injury, nasal tenderness, mucosal edema or congestion. Rhinorrhea is clear.     Right Nostril: Occlusion present. No foreign body, epistaxis or septal hematoma.     Left Nostril: Occlusion present. No foreign body, epistaxis or septal hematoma.     Right Turbinates: Enlarged, swollen and pale.     Left Turbinates: Enlarged, swollen and pale.     Right Sinus: No maxillary sinus tenderness or frontal  sinus tenderness.     Left Sinus: No maxillary sinus tenderness or frontal sinus tenderness.     Mouth/Throat:     Lips: Pink. No lesions.     Mouth: Mucous membranes are moist. No oral lesions.     Pharynx: Oropharynx is clear. Uvula midline. No  posterior oropharyngeal erythema or uvula swelling.     Tonsils: No tonsillar exudate. 0 on the right. 0 on the left.     Comments: Postnasal drip Eyes:     General: Lids are normal.        Right eye: No discharge.        Left eye: No discharge.     Extraocular Movements: Extraocular movements intact.     Conjunctiva/sclera: Conjunctivae normal.     Right eye: Right conjunctiva is not injected.     Left eye: Left conjunctiva is not injected.  Neck:     Trachea: Trachea and phonation normal.  Cardiovascular:     Rate and Rhythm: Normal rate and regular rhythm.     Pulses: Normal pulses.     Heart sounds: Normal heart sounds. No murmur heard.   No friction rub. No gallop.  Pulmonary:     Effort: Pulmonary effort is normal. No accessory muscle usage, prolonged expiration or respiratory distress.     Breath sounds: No stridor, decreased air movement or transmitted upper airway sounds. Examination of the right-middle field reveals rales. Rales present. No decreased breath sounds, wheezing or rhonchi.  Chest:     Chest wall: No tenderness.  Musculoskeletal:        General: Normal range of motion.     Cervical back: Normal range of motion and neck supple. Normal range of motion.  Lymphadenopathy:     Cervical: No cervical adenopathy.  Skin:    General: Skin is warm and dry.     Findings: No erythema or rash.  Neurological:     General: No focal deficit present.     Mental Status: She is alert and oriented to person, place, and time.  Psychiatric:        Mood and Affect: Mood normal.        Behavior: Behavior normal.    Visual Acuity Right Eye Distance:   Left Eye Distance:   Bilateral Distance:    Right Eye Near:   Left Eye Near:     Bilateral Near:     UC Couse / Diagnostics / Procedures:    EKG  Radiology DG Chest 2 View  Result Date: 09/27/2021 CLINICAL DATA:  Rales right middle lobe. Nasal congestion and cough for 2 weeks. Fever. EXAM: CHEST - 2 VIEW COMPARISON:  None. FINDINGS: Patient is post median sternotomy. The heart is normal in size. Advanced aortic atherosclerosis. No pulmonary edema. No focal airspace disease, pleural effusion, or pneumothorax. Calcified granuloma in the right upper lung zone. No acute osseous findings. IMPRESSION: No acute chest findings. Electronically Signed   By: Narda RutherfordMelanie  Sanford M.D.   On: 09/27/2021 17:51    Procedures Procedures (including critical care time)  UC Diagnoses / Final Clinical Impressions(s)   I have reviewed the triage vital signs and the nursing notes.  Pertinent labs & imaging results that were available during my care of the patient were reviewed by me and considered in my medical decision making (see chart for details).   Final diagnoses:  Acute cough  Rhinosinusitis  Post-nasal drip  Elevated temperature   Mild rales appreciated on auscultation, chest x-ray was normal today.  Given prolonged symptoms, low-grade fever and patient's age, recommend the patient begin azithromycin (patient is penicillin allergic) and follow-up with primary care.  Patient also provided with Flonase for nasal congestion.  ED precautions advised.  ED Prescriptions     Medication Sig Dispense Auth. Provider   azithromycin (ZITHROMAX) 250  MG tablet Take 2 tablets (500 mg total) by mouth daily for 1 day, THEN 1 tablet (250 mg total) daily for 4 days. 6 tablet Theadora Rama Scales, PA-C   fluticasone (FLONASE) 50 MCG/ACT nasal spray Place 1 spray into both nostrils daily. 18 mL Theadora Rama Scales, PA-C      PDMP not reviewed this encounter.  Pending results:  Labs Reviewed - No data to display  Medications Ordered in UC: Medications - No data to display  Disposition  Upon Discharge:  Condition: stable for discharge home Home: take medications as prescribed; routine discharge instructions as discussed; follow up as advised.  Patient presented with an acute illness with associated systemic symptoms and significant discomfort requiring urgent management. In my opinion, this is a condition that a prudent lay person (someone who possesses an average knowledge of health and medicine) may potentially expect to result in complications if not addressed urgently such as respiratory distress, impairment of bodily function or dysfunction of bodily organs.   Routine symptom specific, illness specific and/or disease specific instructions were discussed with the patient and/or caregiver at length.   As such, the patient has been evaluated and assessed, work-up was performed and treatment was provided in alignment with urgent care protocols and evidence based medicine.  Patient/parent/caregiver has been advised that the patient may require follow up for further testing and treatment if the symptoms continue in spite of treatment, as clinically indicated and appropriate.  If the patient was tested for COVID-19, Influenza and/or RSV, then the patient/parent/guardian was advised to isolate at home pending the results of his/her diagnostic coronavirus test and potentially longer if theyre positive. I have also advised pt that if his/her COVID-19 test returns positive, it's recommended to self-isolate for at least 10 days after symptoms first appeared AND until fever-free for 24 hours without fever reducer AND other symptoms have improved or resolved. Discussed self-isolation recommendations as well as instructions for household member/close contacts as per the [facility] and [place] DHHS, and also gave patient the COVID packet with this information.  Patient/parent/caregiver has been advised to return to the [facility] or PCP in 3-5 days if no better; to PCP or the Emergency Department if new signs and  symptoms develop, or if the current signs or symptoms continue to change or worsen for further workup, evaluation and treatment as clinically indicated and appropriate  The patient will follow up with their current PCP if and as advised. If the patient does not currently have a PCP we will assist them in obtaining one.   The patient may need specialty follow up if the symptoms continue, in spite of conservative treatment and management, for further workup, evaluation, consultation and treatment as clinically indicated and appropriate.  Patient/parent/caregiver verbalized understanding and agreement of plan as discussed.  All questions were addressed during visit.  Please see discharge instructions below for further details of plan.  Discharge Instructions:   Discharge Instructions      Your chest x-ray was not concerning for any pulmonary disease however you do have significant, advanced aortic atherosclerosis.  This means that there is a lot of plaque in your aorta.  I strongly recommend that you discuss this finding either with your cardiologist or with your primary care provider, the dose of atorvastatin that you are currently taking is not therapeutic and not sufficient to stabilize the plaque in your aorta.  At this time, I am still concerned about the abnormal breath sounds that I heard in your lungs during  physical exam today.  Because we know that the imaging present on chest x-rays can often be one day behind clinical exam findings I believe it would be a good idea for you to begin antibiotic to ensure that you do not develop pneumonia, particularly since you are running a close grade fever at this time.  Also believe that you would have significant improvement of your postnasal drip and runny nose with a nasal steroid which I have also prescribed for you.  Please follow-up with your primary care provider next week regarding these issues.  Thank you for visiting urgent care today.  I  appreciate the opportunity to participate in your care.    This office note has been dictated using Teaching laboratory technician.  Unfortunately, and despite my best efforts, this method of dictation can sometimes lead to occasional typographical or grammatical errors.  I apologize in advance if this occurs.     Theadora Rama Scales, PA-C 09/27/21 [phone]

## 2021-09-27 NOTE — ED Triage Notes (Signed)
Pt reports nasal congestion and cough x 2 weeks and low grade fever x 3 days.  ?

## 2021-09-27 NOTE — Discharge Instructions (Addendum)
Your chest x-ray was not concerning for any pulmonary disease however you do have significant, advanced aortic atherosclerosis.  This means that there is a lot of plaque in your aorta.  I strongly recommend that you discuss this finding either with your cardiologist or with your primary care provider, the dose of atorvastatin that you are currently taking is not therapeutic and not sufficient to stabilize the plaque in your aorta. ? ?At this time, I am still concerned about the abnormal breath sounds that I heard in your lungs during physical exam today.  Because we know that the imaging present on chest x-rays can often be one day behind clinical exam findings I believe it would be a good idea for you to begin antibiotic to ensure that you do not develop pneumonia, particularly since you are running a close grade fever at this time. ? ?Also believe that you would have significant improvement of your postnasal drip and runny nose with a nasal steroid which I have also prescribed for you. ? ?Please follow-up with your primary care provider next week regarding these issues. ? ?Thank you for visiting urgent care today.  I appreciate the opportunity to participate in your care. ?

## 2021-09-28 ENCOUNTER — Encounter: Payer: Self-pay | Admitting: Internal Medicine

## 2021-10-10 ENCOUNTER — Encounter: Payer: Self-pay | Admitting: Internal Medicine

## 2021-10-10 ENCOUNTER — Other Ambulatory Visit: Payer: Self-pay

## 2021-10-10 ENCOUNTER — Ambulatory Visit (INDEPENDENT_AMBULATORY_CARE_PROVIDER_SITE_OTHER): Payer: Medicare Other | Admitting: Internal Medicine

## 2021-10-10 VITALS — BP 126/58 | HR 70 | Resp 18 | Ht 60.0 in | Wt 124.4 lb

## 2021-10-10 DIAGNOSIS — E118 Type 2 diabetes mellitus with unspecified complications: Secondary | ICD-10-CM | POA: Diagnosis not present

## 2021-10-10 NOTE — Assessment & Plan Note (Deleted)
Previously HgA1c at 8 which is acceptable on farxiga 5 mg daily and metformin 1000 mg BID. Checking HgA1c today as well as CBC and CMP. Adjust as needed. She is on statin and ARB. ?

## 2021-10-10 NOTE — Patient Instructions (Signed)
We will check the labs today. 

## 2021-10-10 NOTE — Assessment & Plan Note (Signed)
Previously HgA1c at 8 which is acceptable on farxiga 5 mg daily and metformin 1000 mg BID. Checking HgA1c today as well as CBC and CMP. Adjust as needed. She is on statin and ARB. ?

## 2021-10-10 NOTE — Progress Notes (Signed)
? ?  Subjective:  ? ?Patient ID: Pamela Barnett, female    DOB: Apr 03, 1933, 88 y.o.   MRN: [id number] ? ?HPI ?The patient is an 88 YO female coming in for follow up. ? ?Review of Systems  ?Constitutional: Negative.   ?HENT: Negative.    ?Eyes: Negative.   ?Respiratory:  Negative for cough, chest tightness and shortness of breath.   ?Cardiovascular:  Negative for chest pain, palpitations and leg swelling.  ?Gastrointestinal:  Negative for abdominal distention, abdominal pain, constipation, diarrhea, nausea and vomiting.  ?Musculoskeletal: Negative.   ?Skin: Negative.   ?Neurological: Negative.   ?Psychiatric/Behavioral: Negative.    ? ?Objective:  ?Physical Exam ?Constitutional:   ?   Appearance: She is well-developed.  ?HENT:  ?   Head: Normocephalic and atraumatic.  ?Cardiovascular:  ?   Rate and Rhythm: Normal rate and regular rhythm.  ?Pulmonary:  ?   Effort: Pulmonary effort is normal. No respiratory distress.  ?   Breath sounds: Normal breath sounds. No wheezing or rales.  ?Abdominal:  ?   General: Bowel sounds are normal. There is no distension.  ?   Palpations: Abdomen is soft.  ?   Tenderness: There is no abdominal tenderness. There is no rebound.  ?Musculoskeletal:  ?   Cervical back: Normal range of motion.  ?Skin: ?   General: Skin is warm and dry.  ?Neurological:  ?   Mental Status: She is alert and oriented to person, place, and time.  ?   Coordination: Coordination abnormal.  ? ? ?Vitals:  ? 10/10/21 1553  ?BP: (!) 126/58  ?Pulse: 70  ?Resp: 18  ?SpO2: 98%  ?Weight: 124 lb 6.4 oz (56.4 kg)  ?Height: 5' (1.524 m)  ? ? ?This visit occurred during the SARS-CoV-2 public health emergency.  Safety protocols were in place, including screening questions prior to the visit, additional usage of staff PPE, and extensive cleaning of exam room while observing appropriate contact time as indicated for disinfecting solutions.  ? ?Assessment & Plan:  ? ?

## 2021-10-11 LAB — HEMOGLOBIN A1C
Hgb A1c MFr Bld: 7.9 % of total Hgb — ABNORMAL HIGH (ref <5.7)
Mean Plasma Glucose: 180 mg/dL
eAG (mmol/L): 10 mmol/L

## 2021-10-11 LAB — COMPREHENSIVE METABOLIC PANEL
AG Ratio: 1.6 (calc) (ref 1.0–2.5)
ALT: 25 U/L (ref 6–29)
AST: 21 U/L (ref 10–35)
Albumin: 4.1 g/dL (ref 3.6–5.1)
Alkaline phosphatase (APISO): 64 U/L (ref 37–153)
BUN: 20 mg/dL (ref 7–25)
CO2: 28 mmol/L (ref 20–32)
Calcium: 9.4 mg/dL (ref 8.6–10.4)
Chloride: 102 mmol/L (ref 98–110)
Creat: 0.85 mg/dL (ref 0.60–0.95)
Globulin: 2.5 g/dL (calc) (ref 1.9–3.7)
Glucose, Bld: 109 mg/dL — ABNORMAL HIGH (ref 65–99)
Potassium: 4.2 mmol/L (ref 3.5–5.3)
Sodium: 138 mmol/L (ref 135–146)
Total Bilirubin: 0.3 mg/dL (ref 0.2–1.2)
Total Protein: 6.6 g/dL (ref 6.1–8.1)

## 2021-10-11 LAB — CBC
HCT: 32.5 % — ABNORMAL LOW (ref 35.0–45.0)
Hemoglobin: 10.5 g/dL — ABNORMAL LOW (ref 11.7–15.5)
MCH: 28.9 pg (ref 27.0–33.0)
MCHC: 32.3 g/dL (ref 32.0–36.0)
MCV: 89.5 fL (ref 80.0–100.0)
MPV: 10.1 fL (ref 7.5–12.5)
Platelets: 318 10*3/uL (ref 140–400)
RBC: 3.63 10*6/uL — ABNORMAL LOW (ref 3.80–5.10)
RDW: 13 % (ref 11.0–15.0)
WBC: 8.5 10*3/uL (ref 3.8–10.8)

## 2021-10-15 DIAGNOSIS — H524 Presbyopia: Secondary | ICD-10-CM | POA: Diagnosis not present

## 2021-10-15 DIAGNOSIS — H353131 Nonexudative age-related macular degeneration, bilateral, early dry stage: Secondary | ICD-10-CM | POA: Diagnosis not present

## 2021-10-15 DIAGNOSIS — Z961 Presence of intraocular lens: Secondary | ICD-10-CM | POA: Diagnosis not present

## 2021-10-15 DIAGNOSIS — E113593 Type 2 diabetes mellitus with proliferative diabetic retinopathy without macular edema, bilateral: Secondary | ICD-10-CM | POA: Diagnosis not present

## 2021-10-23 ENCOUNTER — Encounter: Payer: Self-pay | Admitting: Internal Medicine

## 2021-11-03 ENCOUNTER — Ambulatory Visit (INDEPENDENT_AMBULATORY_CARE_PROVIDER_SITE_OTHER): Payer: Medicare Other | Admitting: Cardiology

## 2021-11-03 ENCOUNTER — Encounter: Payer: Self-pay | Admitting: Cardiology

## 2021-11-03 VITALS — BP 164/72 | HR 67 | Ht 60.0 in | Wt 122.0 lb

## 2021-11-03 DIAGNOSIS — I10 Essential (primary) hypertension: Secondary | ICD-10-CM

## 2021-11-03 DIAGNOSIS — E1169 Type 2 diabetes mellitus with other specified complication: Secondary | ICD-10-CM

## 2021-11-03 DIAGNOSIS — Z951 Presence of aortocoronary bypass graft: Secondary | ICD-10-CM | POA: Diagnosis not present

## 2021-11-03 DIAGNOSIS — E785 Hyperlipidemia, unspecified: Secondary | ICD-10-CM | POA: Diagnosis not present

## 2021-11-03 DIAGNOSIS — E118 Type 2 diabetes mellitus with unspecified complications: Secondary | ICD-10-CM

## 2021-11-03 DIAGNOSIS — I2510 Atherosclerotic heart disease of native coronary artery without angina pectoris: Secondary | ICD-10-CM | POA: Insufficient documentation

## 2021-11-03 HISTORY — DX: Atherosclerotic heart disease of native coronary artery without angina pectoris: I25.10

## 2021-11-03 NOTE — Progress Notes (Signed)
?Cardiology Office Note:   ? ?Date:  11/03/2021  ? ?ID:  Pamela Barnett, DOB [birth year], MRN [id number] ? ?PCP:  Myrlene Broker, MD  ?Cardiologist:  Garwin Brothers, MD  ? ?Referring MD: Myrlene Broker, *  ? ? ?ASSESSMENT:   ? ?1. Hyperlipidemia associated with type 2 diabetes mellitus (HCC)   ?2. Coronary artery disease involving native coronary artery of native heart without angina pectoris   ?3. Primary hypertension   ?4. Hx of CABG   ?5. Type 2 diabetes with complication (HCC)   ? ?PLAN:   ? ?In order of problems listed above: ? ?Coronary artery disease: Secondary prevention stressed with the patient.  Importance of compliance with diet medication stressed and she vocalized understanding.  I told her to be active and ambulate to the best of her ability. ?Essential hypertension: Blood pressure stable and diet was emphasized.  She has an element of whitecoat hypertension.  Her blood pressures are fine at home and she will keep a track of them and take to her primary care in the next few weeks.  She will also get blood work with them at that time. ?Mixed dyslipidemia: Diet was emphasized.  Last lipids from [facility] sheet are fine and she plans to go to her primary care in the next few weeks to get blood work. ?Diabetes mellitus: Diet emphasized.  This is followed by primary care. ?Patient will be seen in follow-up appointment in 12 months or earlier if the patient has any concerns ? ? ? ?Medication Adjustments/Labs and Tests Ordered: ?Current medicines are reviewed at length with the patient today.  Concerns regarding medicines are outlined above.  ?No orders of the defined types were placed in this encounter. ? ?No orders of the defined types were placed in this encounter. ? ? ? ?No chief complaint on file. ?  ? ?History of Present Illness:   ? ?Pamela Barnett is a 88 y.o. female.  Patient has past medical history of coronary artery disease post CABG surgery, essential hypertension,  dyslipidemia and diabetes mellitus.  She denies any problems at this time and takes care of activities of daily living.  No chest pain orthopnea or PND.  At the time of my evaluation, the patient is alert awake oriented and in no distress. ? ?Past Medical History:  ?Diagnosis Date  ? Chronic pain of both knees 07/02/2021  ? Hx of CABG 01/24/2021  ? Hyperlipidemia associated with type 2 diabetes mellitus (HCC) 12/20/2020  ? Normocytic anemia 01/20/2021  ? Primary hypertension 12/20/2020  ? Type 2 diabetes with complication (HCC) 01/23/2021  ? ? ?Past Surgical History:  ?Procedure Laterality Date  ? cataracts surgery  2011  ? EYE SURGERY    ? VEIN BYPASS SURGERY  1998  ? ? ?Current Medications: ?Current Meds  ?Medication Sig  ? Accu-Chek FastClix Lancets MISC USE AS DIRECTED  ? ACCU-CHEK GUIDE test strip 1 each by Other route See admin instructions.  ? amLODipine (NORVASC) 5 MG tablet Take 1 tablet (5 mg total) by mouth in the morning and at bedtime.  ? Ascorbic Acid (VITAMIN C) 100 MG tablet Take 100 mg by mouth daily.  ? aspirin EC 81 MG tablet Take 81 mg by mouth daily. Swallow whole.  ? atorvastatin (LIPITOR) 10 MG tablet Take 1 tablet (10 mg total) by mouth daily.  ? Cholecalciferol (VITAMIN D3) 125 MCG (5000 UT) CAPS Take 1 capsule (5,000 Units total) by mouth daily.  ? hydrochlorothiazide (MICROZIDE) 12.5 MG capsule Take  1 capsule (12.5 mg total) by mouth daily.  ? irbesartan (AVAPRO) 300 MG tablet Take 1 tablet (300 mg total) by mouth daily.  ? isosorbide mononitrate (IMDUR) 30 MG 24 hr tablet Take 1 tablet (30 mg total) by mouth daily.  ? Krill Oil 350 MG CAPS Take 350 mg by mouth daily.  ? metFORMIN (GLUCOPHAGE) 1000 MG tablet Take 1 tablet (1,000 mg total) by mouth 2 (two) times daily with a meal.  ? Multiple Vitamins-Minerals (IMMUNE SUPPORT VITAMIN C) PACK Take 1 tablet by mouth daily. Airborne  ? nitroGLYCERIN (NITROSTAT) 0.4 MG SL tablet Place 1 tablet (0.4 mg total) under the tongue every 5 (five) minutes  as needed for chest pain.  ?  ? ?Allergies:   Penicillins  ? ?Social History  ? ?Socioeconomic History  ? Marital status: Widowed  ?  Spouse name: Not on file  ? Number of children: Not on file  ? Years of education: Not on file  ? Highest education level: Not on file  ?Occupational History  ? Not on file  ?Tobacco Use  ? Smoking status: Never  ? Smokeless tobacco: Never  ?Substance and Sexual Activity  ? Alcohol use: Never  ? Drug use: Never  ? Sexual activity: Not on file  ?Other Topics Concern  ? Not on file  ?Social History Narrative  ? Not on file  ? ?Social Determinants of Health  ? ?Financial Resource Strain: Not on file  ?Food Insecurity: Not on file  ?Transportation Needs: Not on file  ?Physical Activity: Not on file  ?Stress: Not on file  ?Social Connections: Not on file  ?  ? ?Family History: ?The patient's family history includes Diabetes in her daughter; Myocarditis in her father. ? ?ROS:   ?Please see the history of present illness.    ?All other systems reviewed and are negative. ? ?EKGs/Labs/Other Studies Reviewed:   ? ?The following studies were reviewed today: ?EKG reveals sinus rhythm and nonspecific ST-T changes. ? ? ?Recent Labs: ?01/20/2021: TSH 3.16 ?10/10/2021: ALT 25; BUN 20; Creat 0.85; Hemoglobin 10.5; Platelets 318; Potassium 4.2; Sodium 138  ?Recent Lipid Panel ?   ?Component Value Date/Time  ? CHOL 152 06/30/2021 1549  ? TRIG 115.0 06/30/2021 1549  ? HDL 61.80 06/30/2021 1549  ? CHOLHDL 2 06/30/2021 1549  ? VLDL 23.0 06/30/2021 1549  ? LDLCALC 67 06/30/2021 1549  ? ? ?Physical Exam:   ? ?VS:  BP (!) 164/72   Pulse 67   Ht 5' (1.524 m)   Wt 122 lb 0.6 oz (55.4 kg)   SpO2 99%   BMI 23.83 kg/m?    ? ?Wt Readings from Last 3 Encounters:  ?11/03/21 122 lb 0.6 oz (55.4 kg)  ?10/10/21 124 lb 6.4 oz (56.4 kg)  ?09/27/21 128 lb 1.4 oz (58.1 kg)  ?  ? ?GEN: Patient is in no acute distress ?HEENT: Normal ?NECK: No JVD; No carotid bruits ?LYMPHATICS: No lymphadenopathy ?CARDIAC: Hear sounds  regular, 2/6 systolic murmur at the apex. ?RESPIRATORY:  Clear to auscultation without rales, wheezing or rhonchi  ?ABDOMEN: Soft, non-tender, non-distended ?MUSCULOSKELETAL:  No edema; No deformity  ?SKIN: Warm and dry ?NEUROLOGIC:  Alert and oriented x 3 ?PSYCHIATRIC:  Normal affect  ? ?Signed, ?Garwin Brothers, MD  ?11/03/2021 10:10 AM    ?[facility]  ?

## 2021-11-03 NOTE — Patient Instructions (Signed)

## 2022-01-15 ENCOUNTER — Encounter: Payer: Self-pay | Admitting: Internal Medicine

## 2022-01-15 ENCOUNTER — Ambulatory Visit (INDEPENDENT_AMBULATORY_CARE_PROVIDER_SITE_OTHER): Payer: Medicare Other | Admitting: Internal Medicine

## 2022-01-15 VITALS — Resp 18 | Ht 60.0 in | Wt 120.8 lb

## 2022-01-15 DIAGNOSIS — I10 Essential (primary) hypertension: Secondary | ICD-10-CM

## 2022-01-15 DIAGNOSIS — E118 Type 2 diabetes mellitus with unspecified complications: Secondary | ICD-10-CM | POA: Diagnosis not present

## 2022-01-15 DIAGNOSIS — H9193 Unspecified hearing loss, bilateral: Secondary | ICD-10-CM

## 2022-01-15 DIAGNOSIS — G8929 Other chronic pain: Secondary | ICD-10-CM | POA: Diagnosis not present

## 2022-01-15 DIAGNOSIS — M25562 Pain in left knee: Secondary | ICD-10-CM

## 2022-01-15 DIAGNOSIS — M25561 Pain in right knee: Secondary | ICD-10-CM

## 2022-01-15 DIAGNOSIS — I2510 Atherosclerotic heart disease of native coronary artery without angina pectoris: Secondary | ICD-10-CM

## 2022-01-15 LAB — COMPREHENSIVE METABOLIC PANEL
ALT: 15 U/L (ref 0–35)
AST: 20 U/L (ref 0–37)
Albumin: 3.9 g/dL (ref 3.5–5.2)
Alkaline Phosphatase: 56 U/L (ref 39–117)
BUN: 22 mg/dL (ref 6–23)
CO2: 30 mEq/L (ref 19–32)
Calcium: 9.5 mg/dL (ref 8.4–10.5)
Chloride: 102 mEq/L (ref 96–112)
Creatinine, Ser: 0.93 mg/dL (ref 0.40–1.20)
GFR: 55.4 mL/min — ABNORMAL LOW (ref >60.00)
Glucose, Bld: 120 mg/dL — ABNORMAL HIGH (ref 70–99)
Potassium: 4.1 mEq/L (ref 3.5–5.1)
Sodium: 138 mEq/L (ref 135–145)
Total Bilirubin: 0.4 mg/dL (ref 0.2–1.2)
Total Protein: 6.8 g/dL (ref 6.0–8.3)

## 2022-01-15 LAB — URINALYSIS, ROUTINE W REFLEX MICROSCOPIC
Bilirubin Urine: NEGATIVE
Hgb urine dipstick: NEGATIVE
Ketones, ur: NEGATIVE
Leukocytes,Ua: NEGATIVE
Nitrite: NEGATIVE
RBC / HPF: NONE SEEN (ref 0–?)
Specific Gravity, Urine: 1.015 (ref 1.000–1.030)
Total Protein, Urine: NEGATIVE
Urine Glucose: NEGATIVE
Urobilinogen, UA: 0.2 (ref 0.0–1.0)
pH: 5.5 (ref 5.0–8.0)

## 2022-01-15 LAB — LIPID PANEL
Cholesterol: 144 mg/dL (ref 0–200)
HDL: 60.5 mg/dL (ref >39.00)
LDL Cholesterol: 67 mg/dL (ref 0–99)
NonHDL: 83.59
Total CHOL/HDL Ratio: 2
Triglycerides: 81 mg/dL (ref 0.0–149.0)
VLDL: 16.2 mg/dL (ref 0.0–40.0)

## 2022-01-15 LAB — CBC
HCT: 31.9 % — ABNORMAL LOW (ref 36.0–46.0)
Hemoglobin: 10.3 g/dL — ABNORMAL LOW (ref 12.0–15.0)
MCHC: 32.4 g/dL (ref 30.0–36.0)
MCV: 89.4 fl (ref 78.0–100.0)
Platelets: 264 10*3/uL (ref 150.0–400.0)
RBC: 3.57 Mil/uL — ABNORMAL LOW (ref 3.87–5.11)
RDW: 14.2 % (ref 11.5–15.5)
WBC: 5.5 10*3/uL (ref 4.0–10.5)

## 2022-01-15 NOTE — Patient Instructions (Addendum)
We will get the home health aide order for you.

## 2022-01-15 NOTE — Progress Notes (Incomplete)
   Subjective:   Patient ID: Pamela Barnett, female    DOB: [birth year], 88 y.o.   MRN: [id number]  HPI   Review of Systems  Objective:  Physical Exam  Vitals:   01/15/22 0942  Resp: 18  Weight: 120 lb 12.8 oz (54.8 kg)  Height: 5' (1.524 m)    Assessment & Plan:

## 2022-01-16 DIAGNOSIS — H9193 Unspecified hearing loss, bilateral: Secondary | ICD-10-CM | POA: Insufficient documentation

## 2022-01-16 HISTORY — DX: Unspecified hearing loss, bilateral: H91.93

## 2022-01-16 NOTE — Assessment & Plan Note (Signed)
Home health referral done for home health aide. Her daughter states insurance covers within their network of providers. They are given copy and will pursue or let us know who to send referral to.

## 2022-01-22 ENCOUNTER — Ambulatory Visit: Payer: Medicare Other | Attending: Audiologist | Admitting: Audiologist

## 2022-01-22 DIAGNOSIS — H903 Sensorineural hearing loss, bilateral: Secondary | ICD-10-CM | POA: Insufficient documentation

## 2022-01-22 NOTE — Procedures (Signed)
  Outpatient Audiology and [facility] [address] [place], [place]  [address] [phone]  AUDIOLOGICAL  EVALUATION  NAME: Pamela Barnett     DOB:   October 08, 1932      MRN: [id number]                                                                                     DATE: 01/22/2022     REFERENT: Myrlene Broker, MD STATUS: Outpatient DIAGNOSIS: Sensorineural Hearing Loss Bilateral - Mild     History: [name] was seen for an audiological evaluation. [name] was accompanied to the appointment by her daughter.  [name] is receiving a hearing evaluation due to concerns for difficulty hearing when her daughter is in another room and when there is background noise. This difficulty began gradually. No pain or pressure reported in either ear. Tinnitus denied for both ears. History of occasional dizziness lasting a few seconds which rarely happens now. [name] had a hearing test pre-pandemic, she was told to just monitor her hearing. [name] speaks some english and her daughter clarified in her native language as needed.  Medical history positive for diabetes which is a risk factor for hearing loss. No other relevant case history reported.   Evaluation:  Otoscopy showed a clear view of the tympanic membranes, bilaterally Tympanometry results were consistent with normal middle ear function, bilaterally   Audiometric testing was completed using conventional audiometry with insert and supraural transducer. Speech Recognition Thresholds were not tested due to inability to test in native language. Pure tone thresholds show mild sensorineural hearing loss in both ears sloping to a moderate loss after 4kHz.  Results:  The test results were reviewed with [name] and her daughter. [name] has a mild sensorineural  hearing loss in each ear due to natural loss with aging. Since [name] only has difficulty hearing in noise, and occasionally when people are at a  distance, hearing aids not yet recommended.  [name] has good hearing to understand speech when people are face to face within five feet. Recommend retesting hearing if [name] starts to have more trouble understanding. Daughter and [name] reported understanding.   Recommendations: 1.   Hearing aids not recommended at this time due to mild degree of loss. Further audiologic monitoring is needed as future hearing concerns arise.    32 minutes spent testing and counseling on results.   Ammie Ferrier  Audiologist, Au.D., CCC-A 01/22/2022  9:47 AM  Cc: Myrlene Broker, MD

## 2022-02-02 ENCOUNTER — Encounter: Payer: Self-pay | Admitting: Internal Medicine

## 2022-02-04 ENCOUNTER — Encounter: Payer: Self-pay | Admitting: Internal Medicine

## 2022-02-04 ENCOUNTER — Other Ambulatory Visit: Payer: Self-pay

## 2022-02-04 DIAGNOSIS — I10 Essential (primary) hypertension: Secondary | ICD-10-CM

## 2022-02-04 DIAGNOSIS — E1169 Type 2 diabetes mellitus with other specified complication: Secondary | ICD-10-CM

## 2022-02-04 DIAGNOSIS — E119 Type 2 diabetes mellitus without complications: Secondary | ICD-10-CM

## 2022-02-04 MED ORDER — HYDROCHLOROTHIAZIDE 12.5 MG PO CAPS: 12.5000 mg | ORAL_CAPSULE | Freq: Every day | ORAL | 3 refills | Status: DC

## 2022-02-04 MED ORDER — IRBESARTAN 300 MG PO TABS: 300.0000 mg | ORAL_TABLET | Freq: Every day | ORAL | 3 refills | Status: DC

## 2022-02-04 MED ORDER — ATORVASTATIN CALCIUM 10 MG PO TABS: 10.0000 mg | ORAL_TABLET | Freq: Every day | ORAL | 3 refills | Status: DC

## 2022-02-04 MED ORDER — METFORMIN HCL 1000 MG PO TABS: 1000.0000 mg | ORAL_TABLET | Freq: Two times a day (BID) | ORAL | 3 refills | Status: DC

## 2022-02-04 MED ORDER — ACCU-CHEK GUIDE VI STRP: 1.0000 | ORAL_STRIP | 5 refills | Status: DC

## 2022-02-04 MED ORDER — ACCU-CHEK FASTCLIX LANCETS MISC: 5 refills | Status: DC

## 2022-02-04 MED ORDER — AMLODIPINE BESYLATE 5 MG PO TABS: 5.0000 mg | ORAL_TABLET | Freq: Two times a day (BID) | ORAL | 3 refills | Status: DC

## 2022-02-04 MED ORDER — ISOSORBIDE MONONITRATE ER 30 MG PO TB24: 30.0000 mg | ORAL_TABLET | Freq: Every day | ORAL | 3 refills | Status: DC

## 2022-03-25 ENCOUNTER — Encounter: Payer: Self-pay | Admitting: Internal Medicine

## 2022-03-25 DIAGNOSIS — G8929 Other chronic pain: Secondary | ICD-10-CM

## 2022-03-31 ENCOUNTER — Encounter: Payer: Self-pay | Admitting: Internal Medicine

## 2022-03-31 NOTE — Telephone Encounter (Signed)
Pts daughter states "I was wondering if you could send a referral to the following fax [facility] services) for in-home PT a few times a week.  She has seen Dr. [name] for her knees before, and completed PT outside the home earlier this year, and she saw some improvement. "

## 2022-03-31 NOTE — Addendum Note (Signed)
Addended by: Hillard Danker A on: 03/31/2022 12:10 PM   Modules accepted: Orders

## 2022-04-09 NOTE — Telephone Encounter (Signed)
Patient's daughter called about this again.  Please advise results.

## 2022-04-13 NOTE — Telephone Encounter (Signed)
Referral printed and faxed to [facility] to fax number provided per patient request. Confirmation fax received.

## 2022-04-16 NOTE — Telephone Encounter (Signed)
sent 

## 2022-04-16 NOTE — Telephone Encounter (Signed)
Patients daughter called back and said that [place] did receive the referral but they need the last clinical notes and a clearer physical therapy order.  They need it to say Home PT not home health aide.  FAX #  [phone]

## 2022-04-27 ENCOUNTER — Ambulatory Visit (INDEPENDENT_AMBULATORY_CARE_PROVIDER_SITE_OTHER): Payer: Medicare Other | Admitting: Internal Medicine

## 2022-04-27 ENCOUNTER — Encounter: Payer: Self-pay | Admitting: Internal Medicine

## 2022-04-27 VITALS — BP 110/88 | HR 86 | Ht 59.0 in | Wt 124.0 lb

## 2022-04-27 DIAGNOSIS — I10 Essential (primary) hypertension: Secondary | ICD-10-CM | POA: Diagnosis not present

## 2022-04-27 DIAGNOSIS — G8929 Other chronic pain: Secondary | ICD-10-CM

## 2022-04-27 DIAGNOSIS — M25562 Pain in left knee: Secondary | ICD-10-CM

## 2022-04-27 DIAGNOSIS — M25561 Pain in right knee: Secondary | ICD-10-CM

## 2022-04-27 DIAGNOSIS — Z23 Encounter for immunization: Secondary | ICD-10-CM

## 2022-04-27 MED ORDER — AMLODIPINE BESYLATE 5 MG PO TABS: 5.0000 mg | ORAL_TABLET | Freq: Every day | ORAL | 3 refills | Status: DC

## 2022-04-27 NOTE — Assessment & Plan Note (Signed)
Updated rx for amlodipine to 5 mg daily which is correct. Continue hctz 12.5 mg daily and amlodipine 5 mg daily and irbesartan 300 mg daily and imdur 30 mg daily.

## 2022-04-27 NOTE — Assessment & Plan Note (Signed)
Referral to home health for PT as this has helped previously with her mobility. She is willing and able to participate. This will lower her risk of falls.

## 2022-04-27 NOTE — Progress Notes (Signed)
   Subjective:   Patient ID: Pamela Barnett, female    DOB: 1933/02/13, 88 y.o.   MRN: [id number]  HPI The patient is an 88 YO female coming in for worsening mobility due to chronic arthritis. Would like to do PT at home. Cannot leave home independently.   Review of Systems  Constitutional:  Positive for activity change.  HENT: Negative.    Eyes: Negative.   Respiratory:  Negative for cough, chest tightness and shortness of breath.   Cardiovascular:  Negative for chest pain, palpitations and leg swelling.  Gastrointestinal:  Negative for abdominal distention, abdominal pain, constipation, diarrhea, nausea and vomiting.  Musculoskeletal:  Positive for arthralgias, gait problem and myalgias.  Skin: Negative.   Psychiatric/Behavioral: Negative.      Objective:  Physical Exam Constitutional:      Appearance: She is well-developed.  HENT:     Head: Normocephalic and atraumatic.  Cardiovascular:     Rate and Rhythm: Normal rate and regular rhythm.  Pulmonary:     Effort: Pulmonary effort is normal. No respiratory distress.     Breath sounds: Normal breath sounds. No wheezing or rales.  Abdominal:     General: Bowel sounds are normal. There is no distension.     Palpations: Abdomen is soft.     Tenderness: There is no abdominal tenderness. There is no rebound.  Musculoskeletal:        General: Tenderness present.     Cervical back: Normal range of motion.  Skin:    General: Skin is warm and dry.  Neurological:     Mental Status: She is alert and oriented to person, place, and time.     Coordination: Coordination abnormal.     Vitals:   04/27/22 1006  BP: 110/88  Pulse: 86  SpO2: 98%  Weight: 124 lb (56.2 kg)  Height: 4\' 11"  (1.499 m)    Assessment & Plan:  Flu shot given at visit

## 2022-05-05 ENCOUNTER — Encounter: Payer: Self-pay | Admitting: Internal Medicine

## 2022-05-05 ENCOUNTER — Ambulatory Visit (INDEPENDENT_AMBULATORY_CARE_PROVIDER_SITE_OTHER): Payer: Medicare Other | Admitting: Internal Medicine

## 2022-05-05 VITALS — BP 100/50 | HR 84 | Temp 98.3°F

## 2022-05-05 DIAGNOSIS — R55 Syncope and collapse: Secondary | ICD-10-CM

## 2022-05-05 NOTE — Progress Notes (Signed)
   Subjective:   Patient ID: Pamela Barnett, female    DOB: 1933/04/17, 88 y.o.   MRN: [id number]  HPI The patient is an 88 YO female coming in for URI, overall improving but had syncope episode at home while ill. Not eating well for several days. Sitting up in bed and then fell backwards unconscious. Some mild confusion about a few minutes afterwards which cleared.   Review of Systems  Constitutional: Negative.   HENT: Negative.    Eyes: Negative.   Respiratory:  Negative for cough, chest tightness and shortness of breath.   Cardiovascular:  Negative for chest pain, palpitations and leg swelling.  Gastrointestinal:  Negative for abdominal distention, abdominal pain, constipation, diarrhea, nausea and vomiting.  Musculoskeletal: Negative.   Skin: Negative.   Neurological:  Positive for syncope.  Psychiatric/Behavioral: Negative.      Objective:  Physical Exam Constitutional:      Appearance: She is well-developed.  HENT:     Head: Normocephalic and atraumatic.  Cardiovascular:     Rate and Rhythm: Normal rate and regular rhythm.  Pulmonary:     Effort: Pulmonary effort is normal. No respiratory distress.     Breath sounds: Normal breath sounds. No wheezing or rales.  Abdominal:     General: Bowel sounds are normal. There is no distension.     Palpations: Abdomen is soft.     Tenderness: There is no abdominal tenderness. There is no rebound.  Musculoskeletal:     Cervical back: Normal range of motion.  Skin:    General: Skin is warm and dry.  Neurological:     Mental Status: She is alert and oriented to person, place, and time.     Coordination: Coordination abnormal.     Vitals:   05/05/22 1403  BP: (!) 100/50  Pulse: 84  Temp: 98.3 F (36.8 C)  TempSrc: Oral  SpO2: 98%   EKG: Rate 80, axis normal, interval normal, low voltage, sinus with some PAC, no st or t wave changes, no significant change compared to prior 2022  Assessment & Plan:

## 2022-05-08 DIAGNOSIS — R55 Syncope and collapse: Secondary | ICD-10-CM | POA: Insufficient documentation

## 2022-05-08 HISTORY — DX: Syncope and collapse: R55

## 2022-05-08 NOTE — Assessment & Plan Note (Signed)
Suspect vasovagal due to poor hydration due to viral illness. EKG done without changes (prior 1st degree sinus block resolved, no new ACS changes). Increase hydration and food now that she is improving from URI.

## 2022-05-25 ENCOUNTER — Telehealth: Payer: Self-pay | Admitting: Internal Medicine

## 2022-05-25 DIAGNOSIS — G8929 Other chronic pain: Secondary | ICD-10-CM | POA: Diagnosis not present

## 2022-05-25 DIAGNOSIS — Z7984 Long term (current) use of oral hypoglycemic drugs: Secondary | ICD-10-CM | POA: Diagnosis not present

## 2022-05-25 DIAGNOSIS — Z7982 Long term (current) use of aspirin: Secondary | ICD-10-CM | POA: Diagnosis not present

## 2022-05-25 DIAGNOSIS — M25562 Pain in left knee: Secondary | ICD-10-CM | POA: Diagnosis not present

## 2022-05-25 DIAGNOSIS — R531 Weakness: Secondary | ICD-10-CM | POA: Diagnosis not present

## 2022-05-25 DIAGNOSIS — M1990 Unspecified osteoarthritis, unspecified site: Secondary | ICD-10-CM | POA: Diagnosis not present

## 2022-05-25 DIAGNOSIS — I10 Essential (primary) hypertension: Secondary | ICD-10-CM | POA: Diagnosis not present

## 2022-05-25 DIAGNOSIS — M25561 Pain in right knee: Secondary | ICD-10-CM | POA: Diagnosis not present

## 2022-05-25 NOTE — Telephone Encounter (Signed)
Okay for verbals °

## 2022-05-25 NOTE — Telephone Encounter (Signed)
Please advise 

## 2022-05-25 NOTE — Telephone Encounter (Signed)
Peter from [facility] home health did an eval on the pt today and is asking for verbal orders to do home visits:  2X for 4 weeks  He would also like orders for OT once a week to start with.  Please call [facility] to confirm: [phone]

## 2022-05-27 DIAGNOSIS — Z7982 Long term (current) use of aspirin: Secondary | ICD-10-CM | POA: Diagnosis not present

## 2022-05-27 DIAGNOSIS — G8929 Other chronic pain: Secondary | ICD-10-CM | POA: Diagnosis not present

## 2022-05-27 DIAGNOSIS — M25562 Pain in left knee: Secondary | ICD-10-CM | POA: Diagnosis not present

## 2022-05-27 DIAGNOSIS — R531 Weakness: Secondary | ICD-10-CM | POA: Diagnosis not present

## 2022-05-27 DIAGNOSIS — I10 Essential (primary) hypertension: Secondary | ICD-10-CM | POA: Diagnosis not present

## 2022-05-27 DIAGNOSIS — M25561 Pain in right knee: Secondary | ICD-10-CM | POA: Diagnosis not present

## 2022-05-27 DIAGNOSIS — M1990 Unspecified osteoarthritis, unspecified site: Secondary | ICD-10-CM | POA: Diagnosis not present

## 2022-05-27 DIAGNOSIS — Z7984 Long term (current) use of oral hypoglycemic drugs: Secondary | ICD-10-CM | POA: Diagnosis not present

## 2022-05-27 NOTE — Telephone Encounter (Signed)
[place] Name: [facility] Agency Name: [facility] Phone #: [phone] Secure line  Service Requested: OT (examples: OT/PT/Skilled Nursing/Social Work/Speech Therapy/Wound Care)  Frequency of Visits: 2 times a week for 2 weeks and then 1 time a week for 1 week

## 2022-05-28 DIAGNOSIS — G8929 Other chronic pain: Secondary | ICD-10-CM | POA: Diagnosis not present

## 2022-05-28 DIAGNOSIS — M25562 Pain in left knee: Secondary | ICD-10-CM | POA: Diagnosis not present

## 2022-05-28 DIAGNOSIS — Z7982 Long term (current) use of aspirin: Secondary | ICD-10-CM | POA: Diagnosis not present

## 2022-05-28 DIAGNOSIS — Z7984 Long term (current) use of oral hypoglycemic drugs: Secondary | ICD-10-CM | POA: Diagnosis not present

## 2022-05-28 DIAGNOSIS — M25561 Pain in right knee: Secondary | ICD-10-CM | POA: Diagnosis not present

## 2022-05-28 DIAGNOSIS — M1990 Unspecified osteoarthritis, unspecified site: Secondary | ICD-10-CM | POA: Diagnosis not present

## 2022-05-28 DIAGNOSIS — I10 Essential (primary) hypertension: Secondary | ICD-10-CM | POA: Diagnosis not present

## 2022-05-28 DIAGNOSIS — R531 Weakness: Secondary | ICD-10-CM | POA: Diagnosis not present

## 2022-06-01 DIAGNOSIS — M25561 Pain in right knee: Secondary | ICD-10-CM | POA: Diagnosis not present

## 2022-06-01 DIAGNOSIS — R531 Weakness: Secondary | ICD-10-CM | POA: Diagnosis not present

## 2022-06-01 DIAGNOSIS — Z7984 Long term (current) use of oral hypoglycemic drugs: Secondary | ICD-10-CM | POA: Diagnosis not present

## 2022-06-01 DIAGNOSIS — M1990 Unspecified osteoarthritis, unspecified site: Secondary | ICD-10-CM | POA: Diagnosis not present

## 2022-06-01 DIAGNOSIS — Z7982 Long term (current) use of aspirin: Secondary | ICD-10-CM | POA: Diagnosis not present

## 2022-06-01 DIAGNOSIS — M25562 Pain in left knee: Secondary | ICD-10-CM | POA: Diagnosis not present

## 2022-06-01 DIAGNOSIS — I10 Essential (primary) hypertension: Secondary | ICD-10-CM | POA: Diagnosis not present

## 2022-06-01 DIAGNOSIS — G8929 Other chronic pain: Secondary | ICD-10-CM | POA: Diagnosis not present

## 2022-06-02 NOTE — Telephone Encounter (Signed)
Ok for verbals 

## 2022-06-04 DIAGNOSIS — Z7984 Long term (current) use of oral hypoglycemic drugs: Secondary | ICD-10-CM | POA: Diagnosis not present

## 2022-06-04 DIAGNOSIS — M25562 Pain in left knee: Secondary | ICD-10-CM | POA: Diagnosis not present

## 2022-06-04 DIAGNOSIS — Z7982 Long term (current) use of aspirin: Secondary | ICD-10-CM | POA: Diagnosis not present

## 2022-06-04 DIAGNOSIS — G8929 Other chronic pain: Secondary | ICD-10-CM | POA: Diagnosis not present

## 2022-06-04 DIAGNOSIS — M1990 Unspecified osteoarthritis, unspecified site: Secondary | ICD-10-CM | POA: Diagnosis not present

## 2022-06-04 DIAGNOSIS — I10 Essential (primary) hypertension: Secondary | ICD-10-CM | POA: Diagnosis not present

## 2022-06-04 DIAGNOSIS — M25561 Pain in right knee: Secondary | ICD-10-CM | POA: Diagnosis not present

## 2022-06-04 DIAGNOSIS — R531 Weakness: Secondary | ICD-10-CM | POA: Diagnosis not present

## 2022-06-05 NOTE — Telephone Encounter (Signed)
Contacted agent will and left him a voicemail stating that dr crawford gave me verbal orders for her.

## 2022-06-08 DIAGNOSIS — M25562 Pain in left knee: Secondary | ICD-10-CM | POA: Diagnosis not present

## 2022-06-08 DIAGNOSIS — Z7982 Long term (current) use of aspirin: Secondary | ICD-10-CM | POA: Diagnosis not present

## 2022-06-08 DIAGNOSIS — G8929 Other chronic pain: Secondary | ICD-10-CM | POA: Diagnosis not present

## 2022-06-08 DIAGNOSIS — R531 Weakness: Secondary | ICD-10-CM | POA: Diagnosis not present

## 2022-06-08 DIAGNOSIS — I10 Essential (primary) hypertension: Secondary | ICD-10-CM | POA: Diagnosis not present

## 2022-06-08 DIAGNOSIS — Z7984 Long term (current) use of oral hypoglycemic drugs: Secondary | ICD-10-CM | POA: Diagnosis not present

## 2022-06-08 DIAGNOSIS — M1990 Unspecified osteoarthritis, unspecified site: Secondary | ICD-10-CM | POA: Diagnosis not present

## 2022-06-08 DIAGNOSIS — M25561 Pain in right knee: Secondary | ICD-10-CM | POA: Diagnosis not present

## 2022-06-09 DIAGNOSIS — M1990 Unspecified osteoarthritis, unspecified site: Secondary | ICD-10-CM | POA: Diagnosis not present

## 2022-06-09 DIAGNOSIS — Z7984 Long term (current) use of oral hypoglycemic drugs: Secondary | ICD-10-CM | POA: Diagnosis not present

## 2022-06-09 DIAGNOSIS — I10 Essential (primary) hypertension: Secondary | ICD-10-CM | POA: Diagnosis not present

## 2022-06-09 DIAGNOSIS — M25561 Pain in right knee: Secondary | ICD-10-CM | POA: Diagnosis not present

## 2022-06-09 DIAGNOSIS — G8929 Other chronic pain: Secondary | ICD-10-CM | POA: Diagnosis not present

## 2022-06-09 DIAGNOSIS — R531 Weakness: Secondary | ICD-10-CM | POA: Diagnosis not present

## 2022-06-09 DIAGNOSIS — Z7982 Long term (current) use of aspirin: Secondary | ICD-10-CM | POA: Diagnosis not present

## 2022-06-09 DIAGNOSIS — M25562 Pain in left knee: Secondary | ICD-10-CM | POA: Diagnosis not present

## 2022-06-11 DIAGNOSIS — R531 Weakness: Secondary | ICD-10-CM | POA: Diagnosis not present

## 2022-06-11 DIAGNOSIS — G8929 Other chronic pain: Secondary | ICD-10-CM | POA: Diagnosis not present

## 2022-06-11 DIAGNOSIS — M1990 Unspecified osteoarthritis, unspecified site: Secondary | ICD-10-CM | POA: Diagnosis not present

## 2022-06-11 DIAGNOSIS — M25561 Pain in right knee: Secondary | ICD-10-CM | POA: Diagnosis not present

## 2022-06-11 DIAGNOSIS — Z7982 Long term (current) use of aspirin: Secondary | ICD-10-CM | POA: Diagnosis not present

## 2022-06-11 DIAGNOSIS — Z7984 Long term (current) use of oral hypoglycemic drugs: Secondary | ICD-10-CM | POA: Diagnosis not present

## 2022-06-11 DIAGNOSIS — M25562 Pain in left knee: Secondary | ICD-10-CM | POA: Diagnosis not present

## 2022-06-11 DIAGNOSIS — I10 Essential (primary) hypertension: Secondary | ICD-10-CM | POA: Diagnosis not present

## 2022-06-16 DIAGNOSIS — I10 Essential (primary) hypertension: Secondary | ICD-10-CM | POA: Diagnosis not present

## 2022-06-16 DIAGNOSIS — R531 Weakness: Secondary | ICD-10-CM | POA: Diagnosis not present

## 2022-06-16 DIAGNOSIS — Z7982 Long term (current) use of aspirin: Secondary | ICD-10-CM | POA: Diagnosis not present

## 2022-06-16 DIAGNOSIS — M1990 Unspecified osteoarthritis, unspecified site: Secondary | ICD-10-CM | POA: Diagnosis not present

## 2022-06-16 DIAGNOSIS — M25561 Pain in right knee: Secondary | ICD-10-CM | POA: Diagnosis not present

## 2022-06-16 DIAGNOSIS — M25562 Pain in left knee: Secondary | ICD-10-CM | POA: Diagnosis not present

## 2022-06-16 DIAGNOSIS — Z7984 Long term (current) use of oral hypoglycemic drugs: Secondary | ICD-10-CM | POA: Diagnosis not present

## 2022-06-16 DIAGNOSIS — G8929 Other chronic pain: Secondary | ICD-10-CM | POA: Diagnosis not present

## 2022-06-22 ENCOUNTER — Encounter: Payer: Self-pay | Admitting: Internal Medicine

## 2022-06-24 DIAGNOSIS — Z7984 Long term (current) use of oral hypoglycemic drugs: Secondary | ICD-10-CM | POA: Diagnosis not present

## 2022-06-24 DIAGNOSIS — M1990 Unspecified osteoarthritis, unspecified site: Secondary | ICD-10-CM | POA: Diagnosis not present

## 2022-06-24 DIAGNOSIS — R531 Weakness: Secondary | ICD-10-CM | POA: Diagnosis not present

## 2022-06-24 DIAGNOSIS — G8929 Other chronic pain: Secondary | ICD-10-CM | POA: Diagnosis not present

## 2022-06-24 DIAGNOSIS — I10 Essential (primary) hypertension: Secondary | ICD-10-CM | POA: Diagnosis not present

## 2022-06-24 DIAGNOSIS — Z7982 Long term (current) use of aspirin: Secondary | ICD-10-CM | POA: Diagnosis not present

## 2022-06-24 DIAGNOSIS — M25562 Pain in left knee: Secondary | ICD-10-CM | POA: Diagnosis not present

## 2022-06-24 DIAGNOSIS — M25561 Pain in right knee: Secondary | ICD-10-CM | POA: Diagnosis not present

## 2022-06-24 NOTE — Telephone Encounter (Signed)
Called patient and left a voicemail to give [place] a call back to get her scheduled

## 2022-06-25 NOTE — Telephone Encounter (Signed)
I was able to schedule the pt to be seen for her physical on 06/30/2022.

## 2022-06-30 ENCOUNTER — Ambulatory Visit (INDEPENDENT_AMBULATORY_CARE_PROVIDER_SITE_OTHER): Payer: Medicare Other | Admitting: Internal Medicine

## 2022-06-30 ENCOUNTER — Encounter: Payer: Self-pay | Admitting: Internal Medicine

## 2022-06-30 VITALS — BP 120/60 | HR 84 | Temp 98.4°F

## 2022-06-30 DIAGNOSIS — E1169 Type 2 diabetes mellitus with other specified complication: Secondary | ICD-10-CM

## 2022-06-30 DIAGNOSIS — Z0001 Encounter for general adult medical examination with abnormal findings: Secondary | ICD-10-CM

## 2022-06-30 DIAGNOSIS — E785 Hyperlipidemia, unspecified: Secondary | ICD-10-CM | POA: Diagnosis not present

## 2022-06-30 DIAGNOSIS — Z0000 Encounter for general adult medical examination without abnormal findings: Secondary | ICD-10-CM

## 2022-06-30 DIAGNOSIS — E118 Type 2 diabetes mellitus with unspecified complications: Secondary | ICD-10-CM

## 2022-06-30 DIAGNOSIS — Z951 Presence of aortocoronary bypass graft: Secondary | ICD-10-CM

## 2022-06-30 LAB — CBC
HCT: 30.6 % — ABNORMAL LOW (ref 36.0–46.0)
Hemoglobin: 10 g/dL — ABNORMAL LOW (ref 12.0–15.0)
MCHC: 32.7 g/dL (ref 30.0–36.0)
MCV: 87.7 fl (ref 78.0–100.0)
Platelets: 288 10*3/uL (ref 150.0–400.0)
RBC: 3.49 Mil/uL — ABNORMAL LOW (ref 3.87–5.11)
RDW: 14.5 % (ref 11.5–15.5)
WBC: 6.3 10*3/uL (ref 4.0–10.5)

## 2022-06-30 LAB — COMPREHENSIVE METABOLIC PANEL
ALT: 20 U/L (ref 0–35)
AST: 20 U/L (ref 0–37)
Albumin: 3.9 g/dL (ref 3.5–5.2)
Alkaline Phosphatase: 60 U/L (ref 39–117)
BUN: 23 mg/dL (ref 6–23)
CO2: 29 mEq/L (ref 19–32)
Calcium: 9.3 mg/dL (ref 8.4–10.5)
Chloride: 101 mEq/L (ref 96–112)
Creatinine, Ser: 0.82 mg/dL (ref 0.40–1.20)
GFR: 64.22 mL/min (ref >60.00)
Glucose, Bld: 139 mg/dL — ABNORMAL HIGH (ref 70–99)
Potassium: 4.3 mEq/L (ref 3.5–5.1)
Sodium: 138 mEq/L (ref 135–145)
Total Bilirubin: 0.3 mg/dL (ref 0.2–1.2)
Total Protein: 6.7 g/dL (ref 6.0–8.3)

## 2022-06-30 LAB — LIPID PANEL
Cholesterol: 136 mg/dL (ref 0–200)
HDL: 54.4 mg/dL (ref >39.00)
LDL Cholesterol: 56 mg/dL (ref 0–99)
NonHDL: 81.43
Total CHOL/HDL Ratio: 2
Triglycerides: 128 mg/dL (ref 0.0–149.0)
VLDL: 25.6 mg/dL (ref 0.0–40.0)

## 2022-06-30 LAB — HEMOGLOBIN A1C: Hgb A1c MFr Bld: 8.3 % — ABNORMAL HIGH (ref 4.6–6.5)

## 2022-06-30 NOTE — Progress Notes (Unsigned)
Subjective:   Patient ID: Pamela Barnett, female    DOB: Jan 03, 1933, 88 y.o.   MRN: [id number]  HPI Here for medicare wellness and physical, no new complaints. Please see A/P for status and treatment of chronic medical problem  Diet: DM since diabetic Physical activity: sedentary, better since home health Pt/Ot Depression/mood screen: negative Hearing: intact to whispered voice, mild loss bilaterally Visual acuity: grossly normal, performs annual eye exam  ADLs: capable Fall risk: low Home safety: good Cognitive evaluation: intact to orientation, naming, recall and repetition EOL planning: adv directives discussed, given packet  Flowsheet Row Office Visit from 06/30/2022 in [facility] at [employer]  PHQ-2 Total Score 0       Flowsheet Row Office Visit from 06/30/2022 in [facility] at [facility]  PHQ-9 Total Score 0         09/27/2021    4:56 PM 01/15/2022    9:48 AM 04/27/2022   10:12 AM 05/05/2022    1:56 PM 06/30/2022    2:25 PM  Fall Risk  Falls in the past year?  0 1 1   Was there an injury with Fall?  0 0 0 0  Fall Risk Category Calculator  0 1 1   Fall Risk Category  Low Low Low   Patient Fall Risk Level Low fall risk   Moderate fall risk   Fall risk Follow up    Falls evaluation completed Falls evaluation completed   I have personally reviewed and have noted 1. The patient's medical and social history - reviewed today no changes 2. Their use of alcohol, tobacco or illicit drugs 3. Their current medications and supplements 4. The patient's functional ability including ADL's, fall risks, home safety risks and hearing or visual impairment. 5. Diet and physical activities 6. Evidence for depression or mood disorders 7. Care team reviewed and updated 8.  The patient is not on an opioid pain medication.  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   HENT: Negative.    Eyes: Negative.   Respiratory:  Negative for cough, chest tightness and shortness of breath.   Cardiovascular:  Negative for chest pain, palpitations and leg swelling.  Gastrointestinal:  Negative for abdominal distention, abdominal pain, constipation, diarrhea, nausea and vomiting.  Musculoskeletal:  Positive for arthralgias, gait problem and myalgias.  Skin: Negative.   Psychiatric/Behavioral: Negative.      Objective:  Physical Exam Constitutional:      Appearance: She is well-developed.  HENT:     Head: Normocephalic and atraumatic.  Cardiovascular:     Rate and Rhythm: Normal rate and regular rhythm.  Pulmonary:     Effort: Pulmonary effort is normal. No respiratory distress.     Breath sounds: Normal breath sounds. No wheezing or rales.  Abdominal:  General: Bowel sounds are normal. There is no distension.     Palpations: Abdomen is soft.     Tenderness: There is no abdominal tenderness. There is no rebound.  Musculoskeletal:     Cervical back: Normal range of motion.  Skin:    General: Skin is warm and dry.     Comments: Foot exam done  Neurological:     Mental Status: She is alert and oriented to person, place, and time.     Coordination: Coordination abnormal.     Comments: Walker/cane at home. Wheelchair for long distances     Vitals:   06/30/22 1418  BP: 120/60   Pulse: 84  Temp: 98.4 F (36.9 C)  TempSrc: Oral  SpO2: 97%    Assessment & Plan:

## 2022-07-01 DIAGNOSIS — Z0001 Encounter for general adult medical examination with abnormal findings: Secondary | ICD-10-CM | POA: Insufficient documentation

## 2022-07-01 HISTORY — DX: Encounter for general adult medical examination with abnormal findings: Z00.01

## 2022-07-01 NOTE — Assessment & Plan Note (Signed)
No new chest pains, taking aspirin 81 mg daily and lipitor 10 mg daily and imdur. Continue.

## 2022-07-01 NOTE — Assessment & Plan Note (Signed)
Checking HgA1c and adjust as needed metformin 1000 mg BID. She is on statin and ARB. Reminded about eye exam. Foot exam done.

## 2022-07-01 NOTE — Assessment & Plan Note (Signed)
Checking lipid panel and adjust lipitor 10 mg daily as needed.  

## 2022-07-01 NOTE — Assessment & Plan Note (Signed)
Flu shot up to date. Covid-19 counseled. Pneumonia complete. Shingrix counseled. Tetanus due at pharmacy. Colonoscopy aged out. Mammogram aged out, pap smear aged out and dexa complete. Counseled about sun safety and mole surveillance. Counseled about the dangers of distracted driving. Given 10 year screening recommendations.

## 2022-08-14 ENCOUNTER — Encounter: Payer: Self-pay | Admitting: Cardiology

## 2022-10-29 ENCOUNTER — Ambulatory Visit: Payer: Medicare Other | Admitting: Cardiology

## 2022-11-02 DIAGNOSIS — H35031 Hypertensive retinopathy, right eye: Secondary | ICD-10-CM | POA: Diagnosis not present

## 2022-11-02 DIAGNOSIS — Z961 Presence of intraocular lens: Secondary | ICD-10-CM | POA: Diagnosis not present

## 2022-11-02 DIAGNOSIS — H353131 Nonexudative age-related macular degeneration, bilateral, early dry stage: Secondary | ICD-10-CM | POA: Diagnosis not present

## 2022-11-02 DIAGNOSIS — E113593 Type 2 diabetes mellitus with proliferative diabetic retinopathy without macular edema, bilateral: Secondary | ICD-10-CM | POA: Diagnosis not present

## 2022-11-02 LAB — HM DIABETES EYE EXAM

## 2022-11-03 ENCOUNTER — Encounter: Payer: Self-pay | Admitting: Internal Medicine

## 2022-12-07 IMAGING — DX DG KNEE AP/LAT W/ SUNRISE*R*
3 series · 3 of 3 positions shown · non-contrast
Comparison: None.

CLINICAL DATA: Right knee pain.

EXAM:
RIGHT KNEE 3 VIEWS

[knee ap]
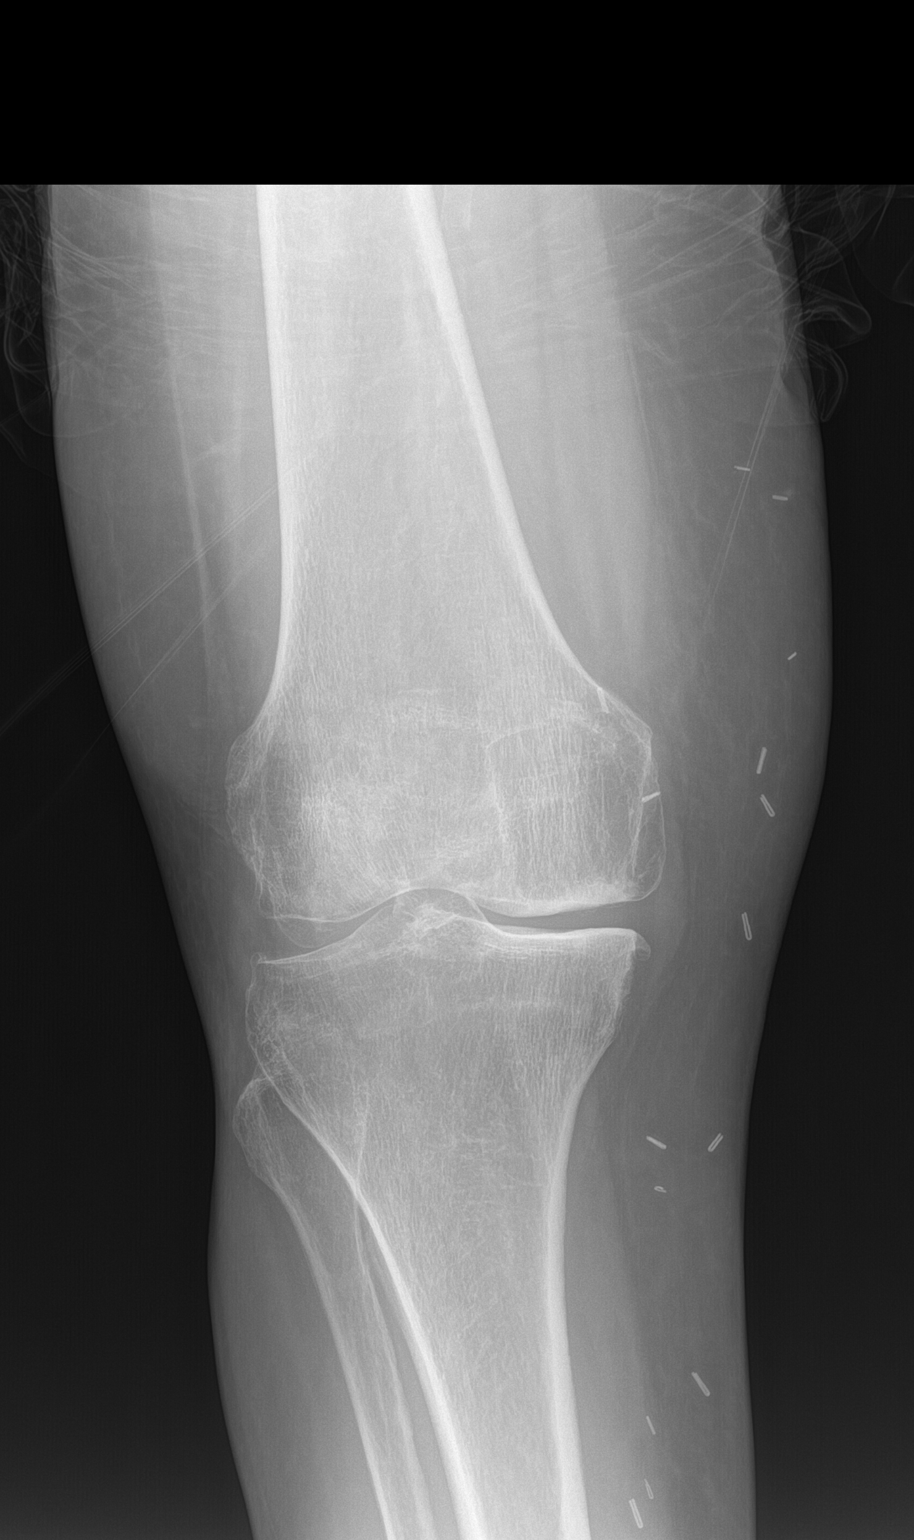

[knee lat]
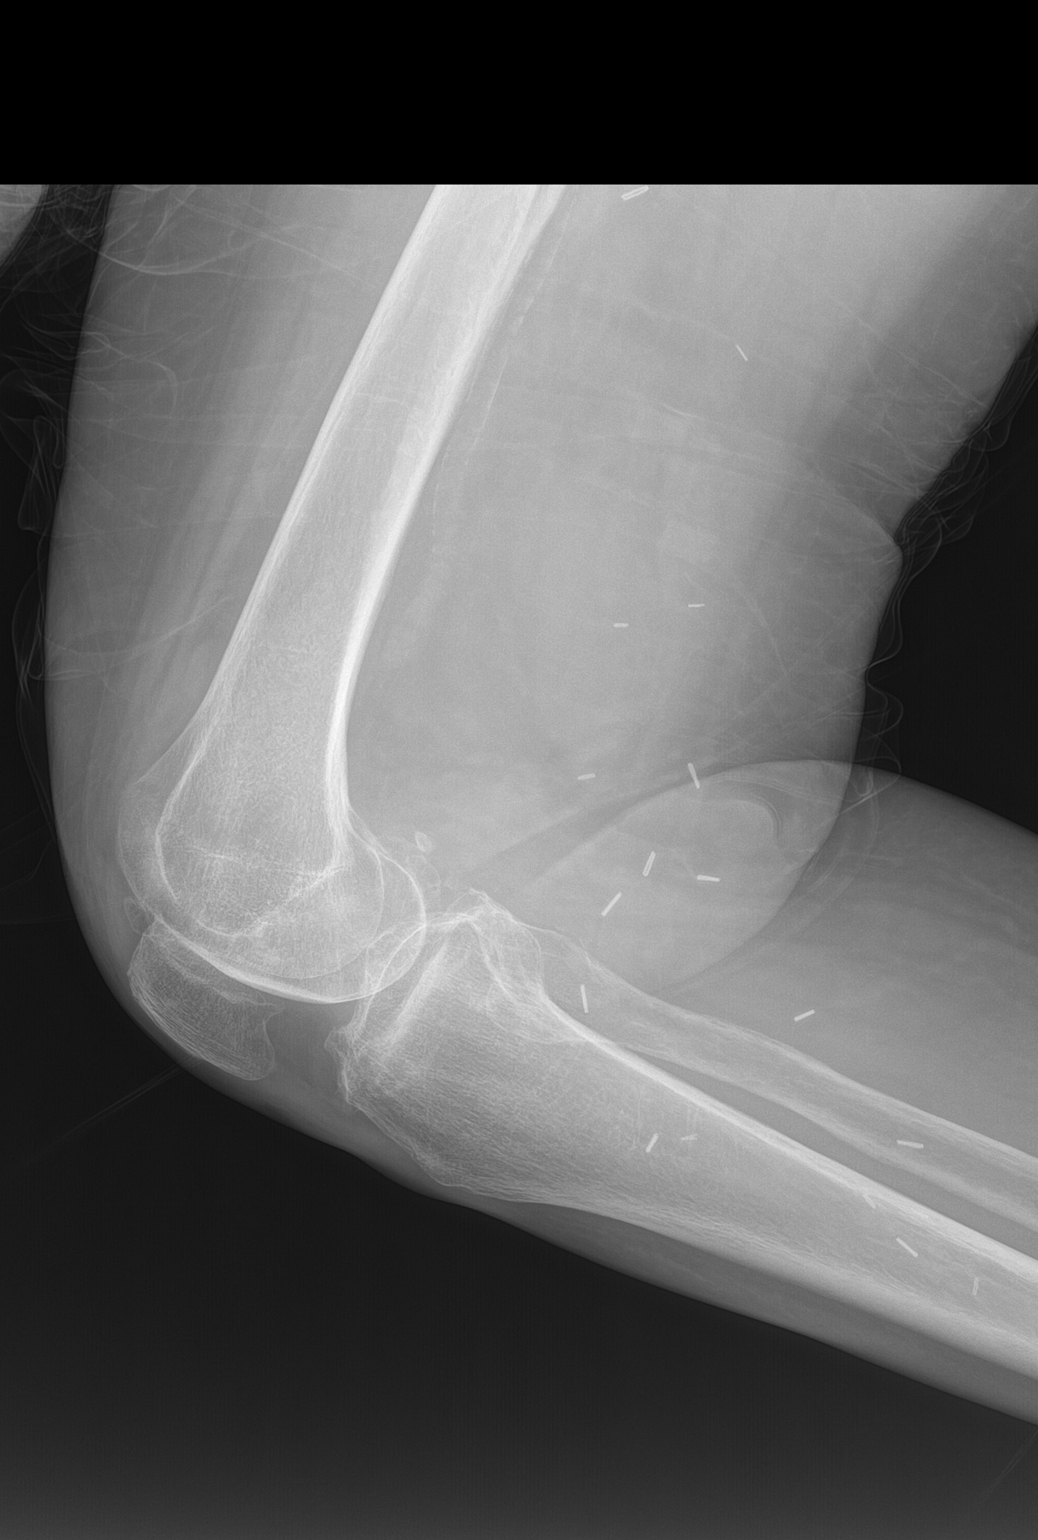

[patella]
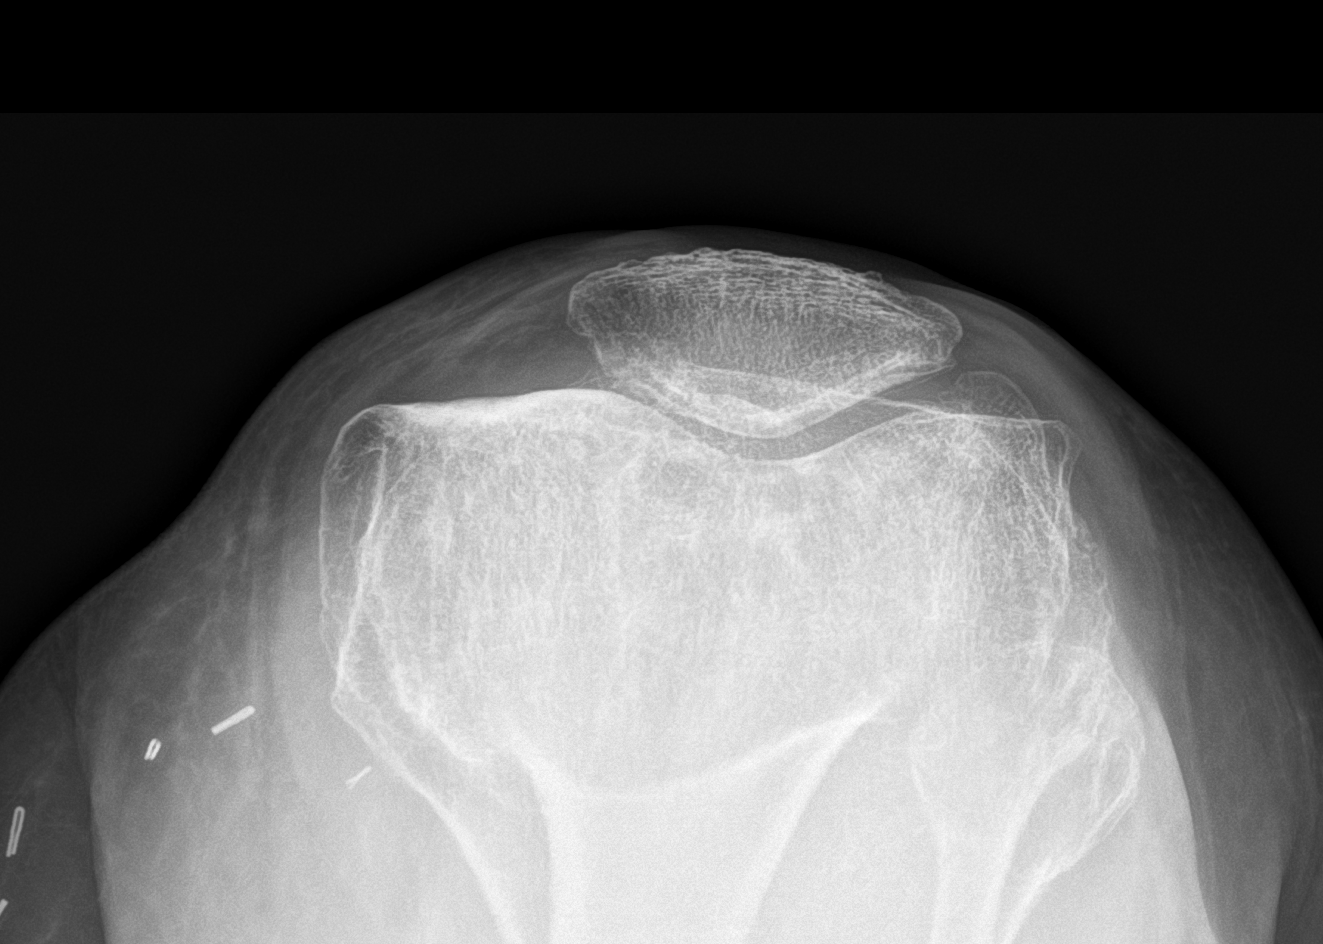

[3 of 3 positions shown; findings below may reference images not displayed]

FINDINGS: There is no evidence for acute fracture or dislocation. There is no
significant joint effusion. There is mild medial compartment joint
space narrowing. There is medial, lateral and patellofemoral
compartment osteophyte formation. There are surgical clips in the
soft tissues of the medial knee.
IMPRESSION: 1. No acute bony abnormality.
2. Mild tricompartmental osteoarthrosis of the right knee.

## 2022-12-07 IMAGING — DX DG KNEE AP/LAT W/ SUNRISE*L*
3 series · 3 of 3 positions shown · non-contrast
Comparison: None.

CLINICAL DATA: Chronic knee pain.

EXAM:
LEFT KNEE 3 VIEWS

[knee ap]
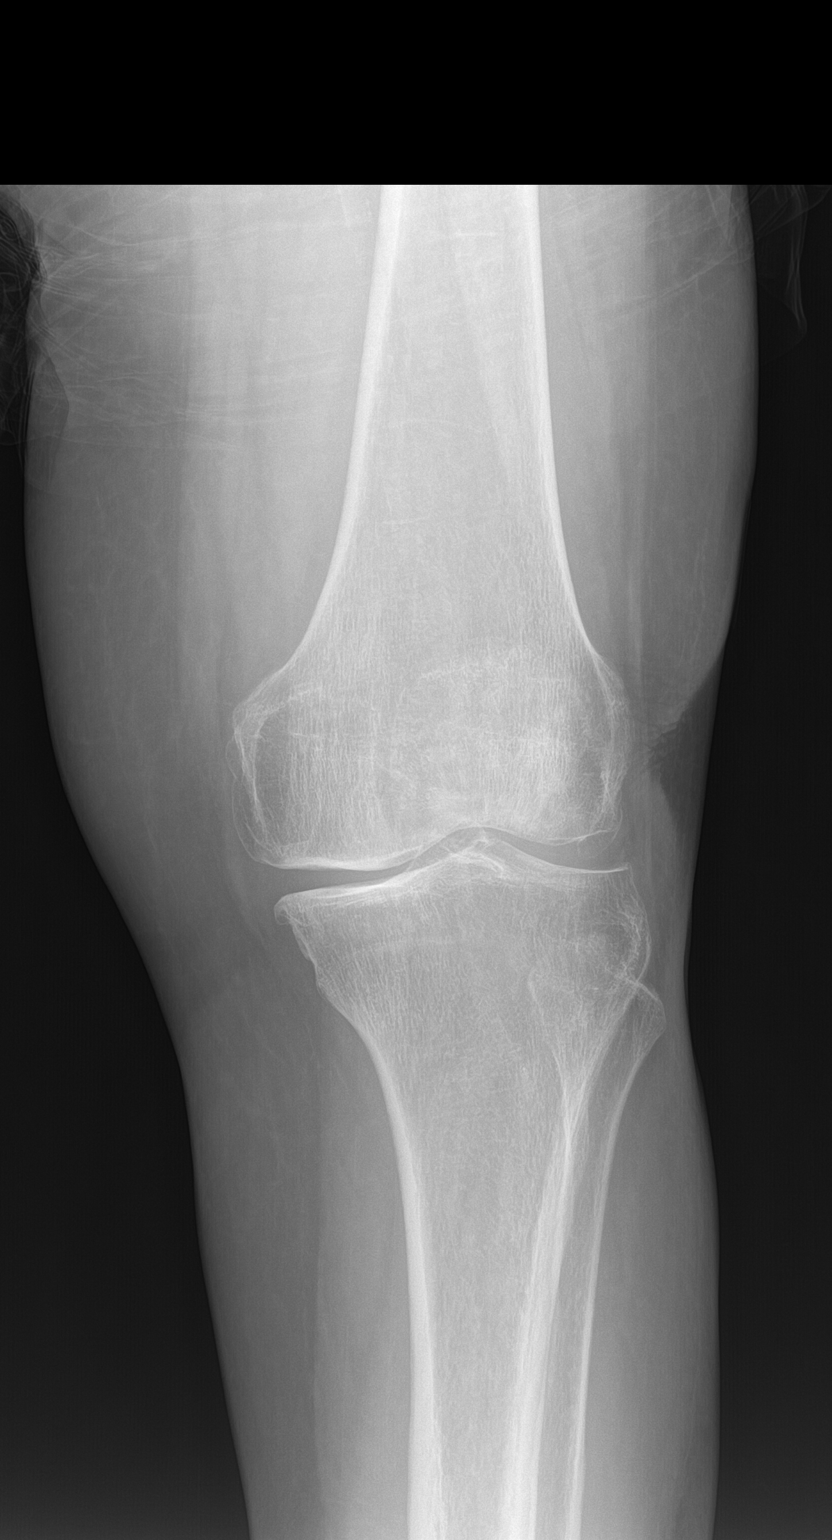

[knee lat]
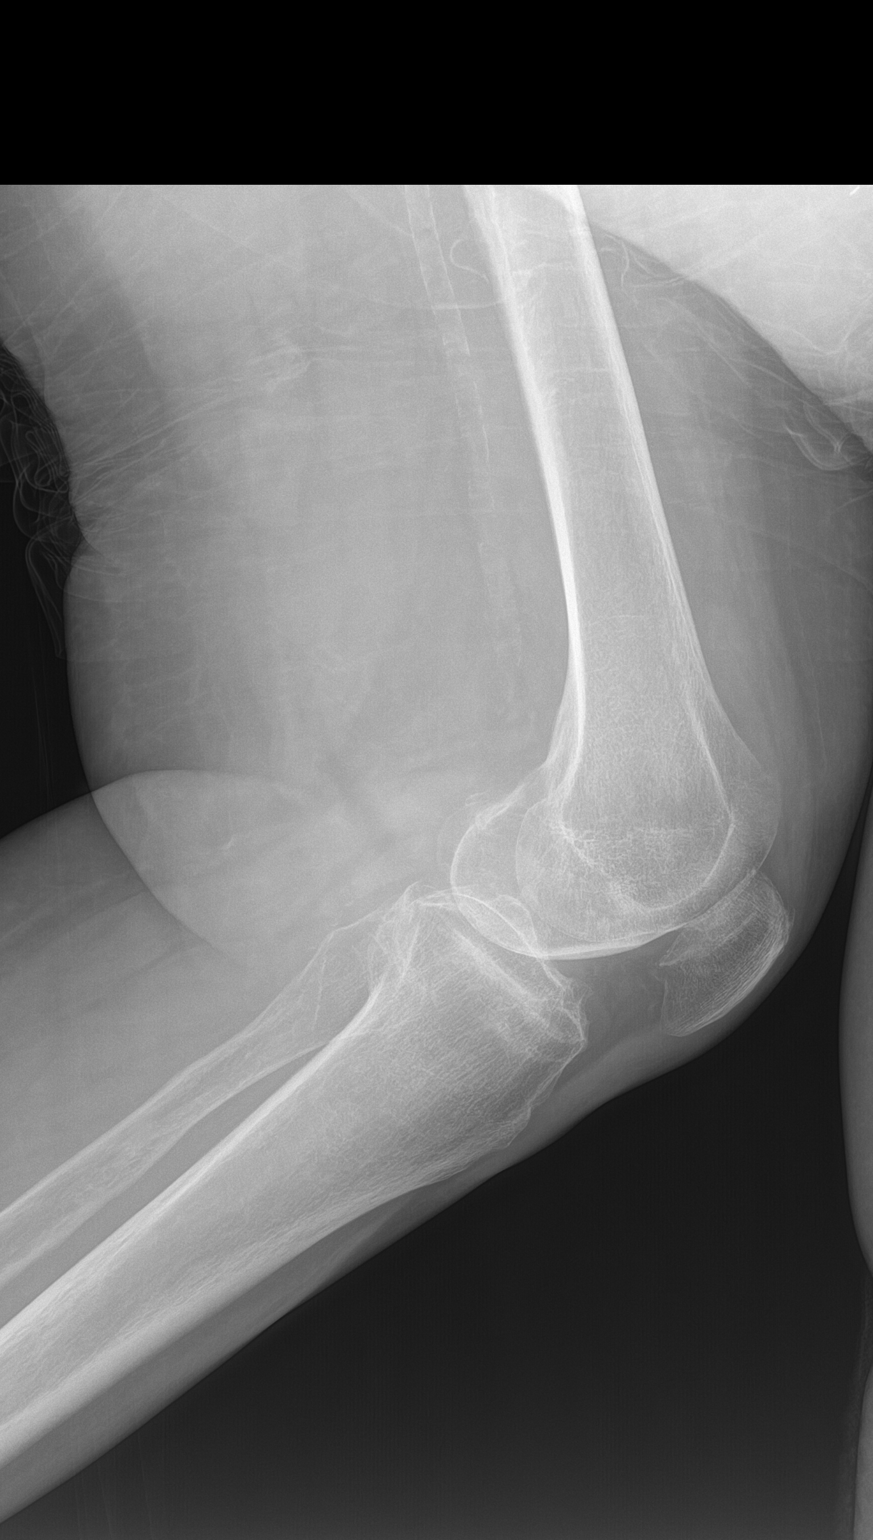

[patella]
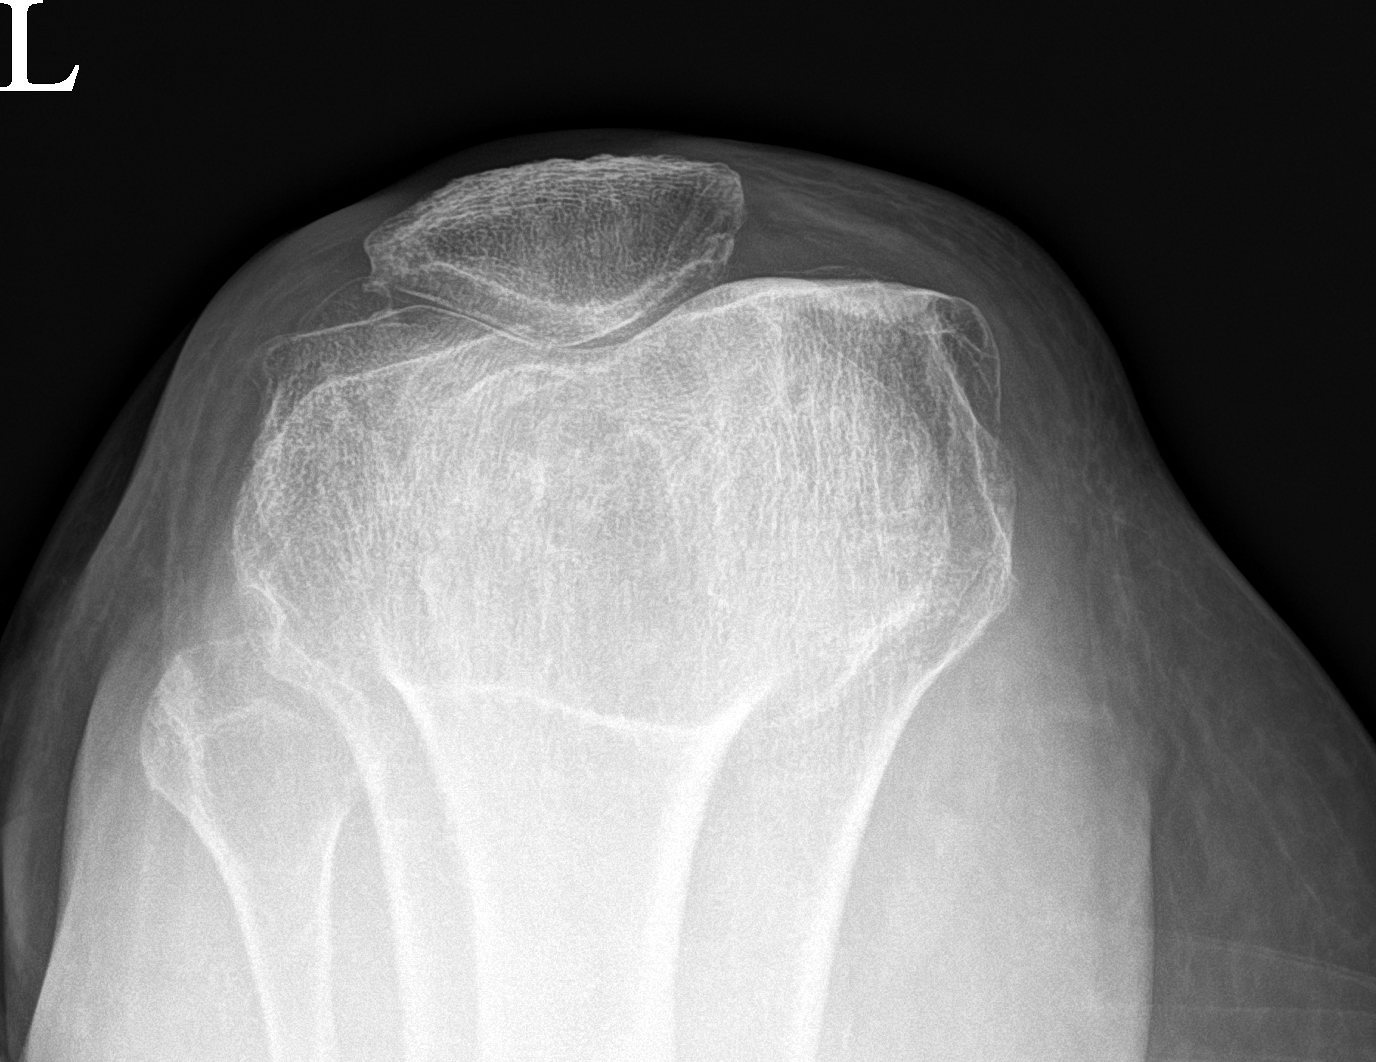

[3 of 3 positions shown; findings below may reference images not displayed]

FINDINGS: There is no acute fracture or dislocation. There is mild medial
compartment joint space narrowing and osteophyte formation
compatible with degenerative change. There are vascular
calcifications in the tissues. The soft tissues are otherwise within
normal limits.
IMPRESSION: 1. No acute bony abnormality.
2. Mild degenerative changes.

## 2022-12-28 ENCOUNTER — Encounter: Payer: Self-pay | Admitting: Internal Medicine

## 2022-12-28 ENCOUNTER — Ambulatory Visit (INDEPENDENT_AMBULATORY_CARE_PROVIDER_SITE_OTHER): Payer: 59 | Admitting: Internal Medicine

## 2022-12-28 VITALS — BP 120/62 | HR 76 | Temp 98.3°F | Ht 59.0 in | Wt 124.0 lb

## 2022-12-28 DIAGNOSIS — E118 Type 2 diabetes mellitus with unspecified complications: Secondary | ICD-10-CM

## 2022-12-28 DIAGNOSIS — E1169 Type 2 diabetes mellitus with other specified complication: Secondary | ICD-10-CM | POA: Diagnosis not present

## 2022-12-28 DIAGNOSIS — Z7984 Long term (current) use of oral hypoglycemic drugs: Secondary | ICD-10-CM

## 2022-12-28 DIAGNOSIS — E2839 Other primary ovarian failure: Secondary | ICD-10-CM

## 2022-12-28 DIAGNOSIS — E785 Hyperlipidemia, unspecified: Secondary | ICD-10-CM

## 2022-12-28 LAB — COMPREHENSIVE METABOLIC PANEL
ALT: 19 U/L (ref 0–35)
AST: 20 U/L (ref 0–37)
Albumin: 4.2 g/dL (ref 3.5–5.2)
Alkaline Phosphatase: 60 U/L (ref 39–117)
BUN: 24 mg/dL — ABNORMAL HIGH (ref 6–23)
CO2: 29 mEq/L (ref 19–32)
Calcium: 9.8 mg/dL (ref 8.4–10.5)
Chloride: 101 mEq/L (ref 96–112)
Creatinine, Ser: 0.99 mg/dL (ref 0.40–1.20)
GFR: 51.05 mL/min — ABNORMAL LOW (ref >60.00)
Glucose, Bld: 132 mg/dL — ABNORMAL HIGH (ref 70–99)
Potassium: 4.2 mEq/L (ref 3.5–5.1)
Sodium: 139 mEq/L (ref 135–145)
Total Bilirubin: 0.4 mg/dL (ref 0.2–1.2)
Total Protein: 7 g/dL (ref 6.0–8.3)

## 2022-12-28 LAB — LIPID PANEL
Cholesterol: 141 mg/dL (ref 0–200)
HDL: 56.3 mg/dL (ref >39.00)
LDL Cholesterol: 67 mg/dL (ref 0–99)
NonHDL: 84.71
Total CHOL/HDL Ratio: 3
Triglycerides: 91 mg/dL (ref 0.0–149.0)
VLDL: 18.2 mg/dL (ref 0.0–40.0)

## 2022-12-28 LAB — HEMOGLOBIN A1C: Hgb A1c MFr Bld: 7.9 % — ABNORMAL HIGH (ref 4.6–6.5)

## 2022-12-28 MED ORDER — LANCET DEVICE MISC: 1.0000 | Freq: Every day | 11 refills | Status: DC

## 2022-12-28 MED ORDER — LANCETS MISC. MISC: 1.0000 | Freq: Every day | 11 refills | Status: DC

## 2022-12-28 MED ORDER — BLOOD GLUCOSE TEST VI STRP: 1.0000 | ORAL_STRIP | Freq: Every day | 11 refills | Status: DC

## 2022-12-28 MED ORDER — NITROGLYCERIN 0.4 MG SL SUBL: 0.4000 mg | SUBLINGUAL_TABLET | SUBLINGUAL | 2 refills | Status: DC | PRN

## 2022-12-28 MED ORDER — BLOOD GLUCOSE MONITORING SUPPL DEVI: 1.0000 | Freq: Every day | 0 refills | Status: AC

## 2022-12-28 NOTE — Assessment & Plan Note (Signed)
Checking HgA1c as last mildly uncontrolled. Discussed diet and overall control and strategy. If persistently uncontrolled will add medication. She is taking metformin 1000 mg BID. Is on ARB and statin.

## 2022-12-28 NOTE — Progress Notes (Signed)
   Subjective:   Patient ID: Pamela Barnett, female    DOB: [birth year], 89 y.o.   MRN: [id number]  HPI The patient is an 89 YO female coming in for follow up.   Review of Systems  Constitutional: Negative.   HENT: Negative.    Eyes: Negative.   Respiratory:  Negative for cough, chest tightness and shortness of breath.   Cardiovascular:  Negative for chest pain, palpitations and leg swelling.  Gastrointestinal:  Negative for abdominal distention, abdominal pain, constipation, diarrhea, nausea and vomiting.  Musculoskeletal:  Positive for arthralgias.  Skin: Negative.   Neurological: Negative.   Psychiatric/Behavioral: Negative.      Objective:  Physical Exam Constitutional:      Appearance: She is well-developed.  HENT:     Head: Normocephalic and atraumatic.  Cardiovascular:     Rate and Rhythm: Normal rate and regular rhythm.  Pulmonary:     Effort: Pulmonary effort is normal. No respiratory distress.     Breath sounds: Normal breath sounds. No wheezing or rales.  Abdominal:     General: Bowel sounds are normal. There is no distension.     Palpations: Abdomen is soft.     Tenderness: There is no abdominal tenderness. There is no rebound.  Musculoskeletal:     Cervical back: Normal range of motion.  Skin:    General: Skin is warm and dry.  Neurological:     Mental Status: She is alert and oriented to person, place, and time.     Coordination: Coordination abnormal.     Comments: Wheelchair in office     Vitals:   12/28/22 0942  BP: 120/62  Pulse: 76  Temp: 98.3 F (36.8 C)  TempSrc: Oral  SpO2: 96%  Weight: 124 lb (56.2 kg)  Height: 4\' 11"  (1.499 m)    Assessment & Plan:

## 2022-12-28 NOTE — Patient Instructions (Addendum)
We have sent in the glucose meter and will do the labs and the bone density.   Iron gluconate is easier on the stomach than iron sulfate is so try to find that one.

## 2022-12-28 NOTE — Assessment & Plan Note (Signed)
Checking lipid panel and adjust lipitor 10 mg daily as needed for goal LDL <100.

## 2023-01-01 ENCOUNTER — Ambulatory Visit (INDEPENDENT_AMBULATORY_CARE_PROVIDER_SITE_OTHER)
Admission: RE | Admit: 2023-01-01 | Discharge: 2023-01-01 | Disposition: A | Discharge status: Home / Self Care | Payer: 59 | Source: Ambulatory Visit | Attending: Internal Medicine | Admitting: Internal Medicine

## 2023-01-01 DIAGNOSIS — E2839 Other primary ovarian failure: Secondary | ICD-10-CM

## 2023-01-15 ENCOUNTER — Ambulatory Visit: Payer: Self-pay | Admitting: Cardiology

## 2023-01-15 ENCOUNTER — Encounter: Payer: Self-pay | Admitting: Cardiology

## 2023-01-15 ENCOUNTER — Ambulatory Visit: Payer: 59 | Attending: Cardiology | Admitting: Cardiology

## 2023-01-15 VITALS — BP 128/66 | HR 78 | Ht 60.0 in | Wt 122.0 lb

## 2023-01-15 DIAGNOSIS — I10 Essential (primary) hypertension: Secondary | ICD-10-CM

## 2023-01-15 DIAGNOSIS — E118 Type 2 diabetes mellitus with unspecified complications: Secondary | ICD-10-CM

## 2023-01-15 DIAGNOSIS — I2510 Atherosclerotic heart disease of native coronary artery without angina pectoris: Secondary | ICD-10-CM

## 2023-01-15 DIAGNOSIS — Z7984 Long term (current) use of oral hypoglycemic drugs: Secondary | ICD-10-CM | POA: Diagnosis not present

## 2023-01-15 DIAGNOSIS — Z951 Presence of aortocoronary bypass graft: Secondary | ICD-10-CM

## 2023-01-15 DIAGNOSIS — R600 Localized edema: Secondary | ICD-10-CM | POA: Insufficient documentation

## 2023-01-15 HISTORY — DX: Localized edema: R60.0

## 2023-01-15 MED ORDER — FUROSEMIDE 40 MG PO TABS: 40.0000 mg | ORAL_TABLET | Freq: Every day | ORAL | 3 refills | Status: DC

## 2023-01-15 NOTE — Progress Notes (Signed)
Cardiology Office Note:    Date:  01/15/2023   ID:  Pamela Barnett, DOB 1932-12-07, MRN [id number]  PCP:  Myrlene Broker, MD  Cardiologist:  Garwin Brothers, MD   Referring MD: Myrlene Broker, *    ASSESSMENT:    1. Coronary artery disease involving native coronary artery of native heart without angina pectoris   2. Primary hypertension   3. Type 2 diabetes with complication (HCC)   4. Hx of CABG   5. Pedal edema    PLAN:    In order of problems listed above:  Coronary artery disease: Secondary prevention stressed with the patient.  Importance of compliance with diet medication stressed and she vocalized understanding. Pedal edema: This is significant and concerning to her.  She tells me that it is impeding ambulation.  For this reason the following recommendations were made to the patient.  I stopped her amlodipine and hydrochlorothiazide.  I initiated her on Lasix 40 mg daily.  She will have an echocardiogram.  She will have a Chem-7 today and she will come back in the next few days for follow-up Chem-7 and also she will keep a track of her pulse and blood pressure and bring the log to [place]. Essential hypertension: Blood pressure stable and diet was emphasized. Mixed dyslipidemia: On lipid-lowering medications followed by primary care. Patient will be seen in follow-up appointment in 2 to 3 weeks or earlier if the patient has any concerns.    Medication Adjustments/Labs and Tests Ordered: Current medicines are reviewed at length with the patient today.  Concerns regarding medicines are outlined above.  No orders of the defined types were placed in this encounter.  No orders of the defined types were placed in this encounter.    No chief complaint on file.    History of Present Illness:    Pamela Barnett is a 89 y.o. female.  Patient has past medical history of coronary artery disease post CABG surgery, essential hypertension, mixed dyslipidemia,  diabetes mellitus.  Her only complaint now is of significant pedal edema.  No chest pain orthopnea or PND.  At the time of my evaluation, the patient is alert awake oriented and in no distress.  She is being careful with salt in the diet.  She is a compliant patient.  Past Medical History:  Diagnosis Date   Bilateral hearing loss 01/16/2022   CAD (coronary artery disease) 11/03/2021   Chronic pain of both knees 07/02/2021   Encounter for general adult medical examination with abnormal findings 07/01/2022   Hx of CABG 01/24/2021   Hyperlipidemia associated with type 2 diabetes mellitus (HCC) 12/20/2020   Normocytic anemia 01/20/2021   Primary hypertension 12/20/2020   Syncope 05/08/2022   Type 2 diabetes with complication (HCC) 01/23/2021    Past Surgical History:  Procedure Laterality Date   cataracts surgery  2011   EYE SURGERY     VEIN BYPASS SURGERY  1998    Current Medications: Current Meds  Medication Sig   Accu-Chek FastClix Lancets MISC USE AS DIRECTED   amLODipine (NORVASC) 5 MG tablet Take 1 tablet (5 mg total) by mouth daily.   Ascorbic Acid (VITAMIN C) 100 MG tablet Take 100 mg by mouth daily.   aspirin EC 81 MG tablet Take 81 mg by mouth daily. Swallow whole.   atorvastatin (LIPITOR) 10 MG tablet Take 1 tablet (10 mg total) by mouth daily.   Blood Glucose Monitoring Suppl DEVI 1 each by Does not apply route daily.  May substitute to any manufacturer covered by patient's insurance.   Cholecalciferol (VITAMIN D3) 125 MCG (5000 UT) CAPS Take 1 capsule (5,000 Units total) by mouth daily.   Glucose Blood (BLOOD GLUCOSE TEST STRIPS) STRP 1 each by In Vitro route daily. May substitute to any manufacturer covered by patient's insurance.   hydrochlorothiazide (MICROZIDE) 12.5 MG capsule Take 1 capsule (12.5 mg total) by mouth daily.   irbesartan (AVAPRO) 300 MG tablet Take 1 tablet (300 mg total) by mouth daily.   isosorbide mononitrate (IMDUR) 30 MG 24 hr tablet Take 1 tablet  (30 mg total) by mouth daily.   Krill Oil 350 MG CAPS Take 350 mg by mouth daily.   Lancet Device MISC 1 each by Does not apply route daily. May substitute to any manufacturer covered by patient's insurance.   Lancets Misc. MISC 1 each by Does not apply route daily. May substitute to any manufacturer covered by patient's insurance.   metFORMIN (GLUCOPHAGE) 1000 MG tablet Take 1 tablet (1,000 mg total) by mouth 2 (two) times daily with a meal.   Multiple Vitamins-Minerals (IMMUNE SUPPORT VITAMIN C) PACK Take 1 tablet by mouth daily. Airborne   nitroGLYCERIN (NITROSTAT) 0.4 MG SL tablet Place 1 tablet (0.4 mg total) under the tongue every 5 (five) minutes as needed for chest pain.     Allergies:   Penicillins   Social History   Socioeconomic History   Marital status: Widowed    Spouse name: Not on file   Number of children: Not on file   Years of education: Not on file   Highest education level: Not on file  Occupational History   Not on file  Tobacco Use   Smoking status: Never   Smokeless tobacco: Never  Substance and Sexual Activity   Alcohol use: Never   Drug use: Never   Sexual activity: Not on file  Other Topics Concern   Not on file  Social History Narrative   Not on file   Social Determinants of Health   Financial Resource Strain: Not on file  Food Insecurity: Not on file  Transportation Needs: Not on file  Physical Activity: Not on file  Stress: Not on file  Social Connections: Not on file     Family History: The patient's family history includes Diabetes in her daughter; Myocarditis in her father.  ROS:   Please see the history of present illness.    All other systems reviewed and are negative.  EKGs/Labs/Other Studies Reviewed:    The following studies were reviewed today: I discussed my findings with the patient at length.   Recent Labs: 06/30/2022: Hemoglobin 10.0; Platelets 288.0 12/28/2022: ALT 19; BUN 24; Creatinine, Ser 0.99; Potassium 4.2; Sodium  139  Recent Lipid Panel    Component Value Date/Time   CHOL 141 12/28/2022 1014   TRIG 91.0 12/28/2022 1014   HDL 56.30 12/28/2022 1014   CHOLHDL 3 12/28/2022 1014   VLDL 18.2 12/28/2022 1014   LDLCALC 67 12/28/2022 1014    Physical Exam:    VS:  BP 128/66   Pulse 78   Ht 5' (1.524 m)   Wt 122 lb 0.6 oz (55.4 kg)   SpO2 95%   BMI 23.83 kg/m     Wt Readings from Last 3 Encounters:  01/15/23 122 lb 0.6 oz (55.4 kg)  12/28/22 124 lb (56.2 kg)  04/27/22 124 lb (56.2 kg)     GEN: Patient is in no acute distress HEENT: Normal NECK: No JVD; No  carotid bruits LYMPHATICS: No lymphadenopathy CARDIAC: Hear sounds regular, 2/6 systolic murmur at the apex. RESPIRATORY:  Clear to auscultation without rales, wheezing or rhonchi  ABDOMEN: Soft, non-tender, non-distended MUSCULOSKELETAL: 2+ pedal edema; No deformity  SKIN: Warm and dry NEUROLOGIC:  Alert and oriented x 3 PSYCHIATRIC:  Normal affect   Signed, Garwin Brothers, MD  01/15/2023 10:34 AM    [facility]

## 2023-01-15 NOTE — Patient Instructions (Signed)
Blood Pressure Record Sheet To take your blood pressure, you will need a blood pressure machine. You can buy a blood pressure machine (blood pressure monitor) at your clinic, drug store, or online. When choosing one, consider: An automatic monitor that has an arm cuff. A cuff that wraps snugly around your upper arm. You should be able to fit only one finger between your arm and the cuff. A device that stores blood pressure reading results. Do not choose a monitor that measures your blood pressure from your wrist or finger. Follow your health care provider's instructions for how to take your blood pressure. To use this form: Get one reading in the morning (a.m.) 1-2 hours after you take any medicines. Get one reading in the evening (p.m.) before supper.   Blood pressure log Date: _______________________  a.m. _____________________(1st reading) HR___________            p.m. _____________________(2nd reading) HR__________  Date: _______________________  a.m. _____________________(1st reading) HR___________            p.m. _____________________(2nd reading) HR__________  Date: _______________________  a.m. _____________________(1st reading) HR___________            p.m. _____________________(2nd reading) HR__________  Date: _______________________  a.m. _____________________(1st reading) HR___________            p.m. _____________________(2nd reading) HR__________  Date: _______________________  a.m. _____________________(1st reading) HR___________            p.m. _____________________(2nd reading) HR__________  Date: _______________________  a.m. _____________________(1st reading) HR___________            p.m. _____________________(2nd reading) HR__________  Date: _______________________  a.m. _____________________(1st reading) HR___________            p.m. _____________________(2nd reading) HR__________   This information is not intended to replace advice  given to you by your health care provider. Make sure you discuss any questions you have with your health care provider. Document Revised: 11/01/2019 Document Reviewed: 11/01/2019 Elsevier Patient Education  2021 Elsevier Inc.   Medication Instructions:  Your physician has recommended you make the following change in your medication:   Stop Amlodipine  Stop Hydrochlorothiazide  Start Lasix 40 mg daily.  *If you need a refill on your cardiac medications before your next appointment, please call your pharmacy*   Lab Work: Your physician recommends that you have a BMP today in the office.  Your physician recommends that you return for lab work in: 1 week for BMP. You need to have labs done when you are fasting. [facility] lab is located on the 3rd floor, [address]. Hours are Monday - Friday 8 am to 4 pm, closed 11:30 am to 1:00 pm. You do NOT need an appointment.   If you have labs (blood work) drawn today and your tests are completely normal, you will receive your results only by: MyChart Message (if you have MyChart) OR A paper copy in the mail If you have any lab test that is abnormal or we need to change your treatment, we will call you to review the results.   Testing/Procedures: Your physician has requested that you have an echocardiogram. Echocardiography is a painless test that uses sound waves to create images of your heart. It provides your doctor with information about the size and shape of your heart and how well your heart's chambers and valves are working. This procedure takes approximately one hour. There are no restrictions for this procedure. Please do NOT wear cologne, perfume, aftershave, or lotions (deodorant is allowed).  Please arrive 15 minutes prior to your appointment time.   Follow-Up: At [facility], you and your health needs are our priority.  As part of our continuing mission to provide you with exceptional heart care, we have created designated Provider  Care Teams.  These Care Teams include your primary Cardiologist (physician) and Advanced Practice Providers (APPs -  Physician Assistants and Nurse Practitioners) who all work together to provide you with the care you need, when you need it.  We recommend signing up for the patient portal called "MyChart".  Sign up information is provided on this After Visit Summary.  MyChart is used to connect with patients for Virtual Visits (Telemedicine).  Patients are able to view lab/test results, encounter notes, upcoming appointments, etc.  Non-urgent messages can be sent to your provider as well.   To learn more about what you can do with MyChart, go to [email or web address].    Your next appointment:   2-3 week(s)  The format for your next appointment:   In Person  Provider:   Belva Crome, MD   Other Instructions Echocardiogram An echocardiogram is a test that uses sound waves (ultrasound) to produce images of the heart. Images from an echocardiogram can provide important information about: Heart size and shape. The size and thickness and movement of your heart's walls. Heart muscle function and strength. Heart valve function or if you have stenosis. Stenosis is when the heart valves are too narrow. If blood is flowing backward through the heart valves (regurgitation). A tumor or infectious growth around the heart valves. Areas of heart muscle that are not working well because of poor blood flow or injury from a heart attack. Aneurysm detection. An aneurysm is a weak or damaged part of an artery wall. The wall bulges out from the normal force of blood pumping through the body. Tell a health care provider about: Any allergies you have. All medicines you are taking, including vitamins, herbs, eye drops, creams, and over-the-counter medicines. Any blood disorders you have. Any surgeries you have had. Any medical conditions you have. Whether you are pregnant or may be pregnant. What are  the risks? Generally, this is a safe test. However, problems may occur, including an allergic reaction to dye (contrast) that may be used during the test. What happens before the test? No specific preparation is needed. You may eat and drink normally. What happens during the test? You will take off your clothes from the waist up and put on a hospital gown. Electrodes or electrocardiogram (ECG)patches may be placed on your chest. The electrodes or patches are then connected to a device that monitors your heart rate and rhythm. You will lie down on a table for an ultrasound exam. A gel will be applied to your chest to help sound waves pass through your skin. A handheld device, called a transducer, will be pressed against your chest and moved over your heart. The transducer produces sound waves that travel to your heart and bounce back (or "echo" back) to the transducer. These sound waves will be captured in real-time and changed into images of your heart that can be viewed on a video monitor. The images will be recorded on a computer and reviewed by your health care provider. You may be asked to change positions or hold your breath for a short time. This makes it easier to get different views or better views of your heart. In some cases, you may receive contrast through an IV in one of your  veins. This can improve the quality of the pictures from your heart. The procedure may vary among health care providers and hospitals.   What can I expect after the test? You may return to your normal, everyday life, including diet, activities, and medicines, unless your health care provider tells you not to do that. Follow these instructions at home: It is up to you to get the results of your test. Ask your health care provider, or the department that is doing the test, when your results will be ready. Keep all follow-up visits. This is important. Summary An echocardiogram is a test that uses sound waves  (ultrasound) to produce images of the heart. Images from an echocardiogram can provide important information about the size and shape of your heart, heart muscle function, heart valve function, and other possible heart problems. You do not need to do anything to prepare before this test. You may eat and drink normally. After the echocardiogram is completed, you may return to your normal, everyday life, unless your health care provider tells you not to do that. This information is not intended to replace advice given to you by your health care provider. Make sure you discuss any questions you have with your health care provider. Document Revised: 03/05/2020 Document Reviewed: 03/05/2020 Elsevier Patient Education  2021 Elsevier Inc.   Important Information About Sugar

## 2023-01-16 ENCOUNTER — Other Ambulatory Visit: Payer: Self-pay | Admitting: Internal Medicine

## 2023-01-16 DIAGNOSIS — E1169 Type 2 diabetes mellitus with other specified complication: Secondary | ICD-10-CM

## 2023-01-16 LAB — BASIC METABOLIC PANEL
BUN/Creatinine Ratio: 20 (ref 12–28)
BUN: 20 mg/dL (ref 8–27)
CO2: 24 mmol/L (ref 20–29)
Calcium: 9.6 mg/dL (ref 8.7–10.3)
Chloride: 102 mmol/L (ref 96–106)
Creatinine, Ser: 0.98 mg/dL (ref 0.57–1.00)
Glucose: 128 mg/dL — ABNORMAL HIGH (ref 70–99)
Potassium: 4.5 mmol/L (ref 3.5–5.2)
Sodium: 140 mmol/L (ref 134–144)
eGFR: 56 mL/min/{1.73_m2} — ABNORMAL LOW (ref >59)

## 2023-01-19 ENCOUNTER — Encounter: Payer: Self-pay | Admitting: Internal Medicine

## 2023-01-19 ENCOUNTER — Ambulatory Visit: Payer: 59 | Attending: Cardiology

## 2023-01-19 VITALS — BP 118/60 | HR 72 | Ht 60.0 in | Wt 122.0 lb

## 2023-01-19 DIAGNOSIS — I2510 Atherosclerotic heart disease of native coronary artery without angina pectoris: Secondary | ICD-10-CM | POA: Diagnosis not present

## 2023-01-19 DIAGNOSIS — R600 Localized edema: Secondary | ICD-10-CM

## 2023-01-19 NOTE — Progress Notes (Signed)
   Nurse Visit   Date of Encounter: 01/19/2023 ID: Pamela Barnett, DOB Sep 13, 1932, MRN [id number]  PCP:  Myrlene Broker, MD   [facility] Providers Cardiologist:  Revankar    Visit Details   VS:  BP 118/60   Pulse 72   Ht 5' (1.524 m)   Wt 122 lb (55.3 kg)   SpO2 94%   BMI 23.83 kg/m  , BMI Body mass index is 23.83 kg/m.  Wt Readings from Last 3 Encounters:  01/19/23 122 lb (55.3 kg)  01/15/23 122 lb 0.6 oz (55.4 kg)  12/28/22 124 lb (56.2 kg)     Reason for visit: nurse  Performed today: Vitals, Provider consulted:Revankar, and Education Changes (medications, testing, etc.) : Cut Lasix to 20 mg daily. Length of Visit: 15 minutes    Medications Adjustments/Labs and Tests Ordered: No orders of the defined types were placed in this encounter.  No orders of the defined types were placed in this encounter.    Signed, Eleonore Chiquito, RN  01/19/2023 10:43 AM

## 2023-01-20 ENCOUNTER — Other Ambulatory Visit: Payer: Self-pay

## 2023-01-20 DIAGNOSIS — E119 Type 2 diabetes mellitus without complications: Secondary | ICD-10-CM

## 2023-01-20 DIAGNOSIS — I10 Essential (primary) hypertension: Secondary | ICD-10-CM

## 2023-01-20 LAB — BASIC METABOLIC PANEL
BUN/Creatinine Ratio: 24 (ref 12–28)
BUN: 24 mg/dL (ref 8–27)
CO2: 26 mmol/L (ref 20–29)
Calcium: 9.5 mg/dL (ref 8.7–10.3)
Chloride: 99 mmol/L (ref 96–106)
Creatinine, Ser: 0.98 mg/dL (ref 0.57–1.00)
Glucose: 132 mg/dL — ABNORMAL HIGH (ref 70–99)
Potassium: 4.4 mmol/L (ref 3.5–5.2)
Sodium: 140 mmol/L (ref 134–144)
eGFR: 56 mL/min/{1.73_m2} — ABNORMAL LOW (ref >59)

## 2023-01-20 MED ORDER — METFORMIN HCL 1000 MG PO TABS
1000.0000 mg | ORAL_TABLET | Freq: Two times a day (BID) | ORAL | 3 refills | Status: DC
Start: 2023-01-20 — End: 2023-11-04

## 2023-01-20 MED ORDER — IRBESARTAN 300 MG PO TABS
300.0000 mg | ORAL_TABLET | Freq: Every day | ORAL | 3 refills | Status: DC
Start: 2023-01-20 — End: 2023-11-04

## 2023-01-20 MED ORDER — ISOSORBIDE MONONITRATE ER 30 MG PO TB24
30.00 mg | ORAL_TABLET | Freq: Every day | ORAL | 3 refills | Status: DC
Start: 2023-01-20 — End: 2023-01-29

## 2023-01-26 ENCOUNTER — Encounter: Payer: Self-pay | Admitting: Cardiology

## 2023-01-26 DIAGNOSIS — I10 Essential (primary) hypertension: Secondary | ICD-10-CM

## 2023-01-29 MED ORDER — ISOSORBIDE MONONITRATE ER 60 MG PO TB24
60.0000 mg | ORAL_TABLET | Freq: Every day | ORAL | 3 refills | Status: DC
Start: 2023-01-29 — End: 2023-03-03

## 2023-02-06 ENCOUNTER — Encounter: Payer: Self-pay | Admitting: Internal Medicine

## 2023-02-09 ENCOUNTER — Encounter: Payer: Self-pay | Admitting: Podiatry

## 2023-02-09 ENCOUNTER — Ambulatory Visit (INDEPENDENT_AMBULATORY_CARE_PROVIDER_SITE_OTHER): Payer: 59

## 2023-02-09 ENCOUNTER — Ambulatory Visit (INDEPENDENT_AMBULATORY_CARE_PROVIDER_SITE_OTHER): Payer: 59 | Admitting: Podiatry

## 2023-02-09 ENCOUNTER — Telehealth: Payer: Self-pay | Admitting: Cardiology

## 2023-02-09 VITALS — BP 155/69

## 2023-02-09 DIAGNOSIS — E118 Type 2 diabetes mellitus with unspecified complications: Secondary | ICD-10-CM

## 2023-02-09 DIAGNOSIS — M79674 Pain in right toe(s): Secondary | ICD-10-CM

## 2023-02-09 DIAGNOSIS — S90121A Contusion of right lesser toe(s) without damage to nail, initial encounter: Secondary | ICD-10-CM

## 2023-02-09 NOTE — Telephone Encounter (Signed)
Patient daughter called and said that they would like a provider switch from Dr. Tomie China to Dr. Jacques Navy. Please confirm transfer

## 2023-02-09 NOTE — Progress Notes (Signed)
  Subjective:  Patient ID: Pamela Barnett, female    DOB: 1932-08-07,   MRN: [id number]  Chief Complaint  Patient presents with   Diabetes   Toe Pain    Right top of foot is sore to the touch, was bluish in color but better now    89 y.o. female presents for concern of discoloration of toes. Relates the top of the right foot is sore to the touch and bluish in color but has been improving.  Relates burning and tingling in their feet. Patient is diabetic and last A1c was  Lab Results  Component Value Date   HGBA1C 7.9 (H) 12/28/2022   .   PCP:  Myrlene Broker, MD    . Denies any other pedal complaints. Denies n/v/f/c.   Past Medical History:  Diagnosis Date   Bilateral hearing loss 01/16/2022   CAD (coronary artery disease) 11/03/2021   Chronic pain of both knees 07/02/2021   Encounter for general adult medical examination with abnormal findings 07/01/2022   Hx of CABG 01/24/2021   Hyperlipidemia associated with type 2 diabetes mellitus (HCC) 12/20/2020   Normocytic anemia 01/20/2021   Primary hypertension 12/20/2020   Syncope 05/08/2022   Type 2 diabetes with complication (HCC) 01/23/2021    Objective:  Physical Exam: Vascular: DP/PT pulses 2/4 bilateral. CFT <3 seconds. Absent hair growth on digits. Edema noted to bilateral lower extremities. Xerosis noted bilaterally mild.  Skin. No lacerations or abrasions bilateral feet. Nails 1-5 bilateral  are normal in appearance. No discolorations noted today.  Musculoskeletal: MMT 5/5 bilateral lower extremities in DF, PF, Inversion and Eversion. Deceased ROM in DF of ankle joint.  Neurological: Sensation intact to light touch. Protective sensation intact bilateral.    Assessment:   1. Type 2 diabetes with complication (HCC)   2. Contusion of toe of right foot, unspecified toe, initial encounter      Plan:  Patient was evaluated and treated and all questions answered. X-rays reviewed and discussed with patient. No  acute fractures or dislocations noted.   Pes cavus noted with some degenerative changes noted in ankle joint and midfoot joints. Possible age indeterminate fracture of the distal fifth metatarsal head.  -Discussed and educated patient on diabetic foot care, especially with  regards to the vascular, neurological and musculoskeletal systems.  -Stressed the importance of good glycemic control and the detriment of not  controlling glucose levels in relation to the foot. -Discussed supportive shoes at all times and checking feet regularly.  -Discussed discoloration could have been some bruising from an injury but appears to be resolved today and no major concerns.  -Answered all patient questions -Patient to return  in 1 year for DM foot check.  -Patient advised to call the office if any problems or questions arise in the meantime.   Louann Sjogren, DPM

## 2023-02-10 ENCOUNTER — Ambulatory Visit (HOSPITAL_BASED_OUTPATIENT_CLINIC_OR_DEPARTMENT_OTHER): Payer: 59

## 2023-02-23 ENCOUNTER — Other Ambulatory Visit: Payer: Self-pay

## 2023-02-24 ENCOUNTER — Ambulatory Visit (HOSPITAL_COMMUNITY)
Admission: RE | Admit: 2023-02-24 | Discharge: 2023-02-24 | Disposition: A | Discharge status: Home / Self Care | Payer: 59 | Source: Ambulatory Visit | Attending: Cardiology | Admitting: Cardiology

## 2023-02-24 DIAGNOSIS — E785 Hyperlipidemia, unspecified: Secondary | ICD-10-CM | POA: Diagnosis not present

## 2023-02-24 DIAGNOSIS — I2510 Atherosclerotic heart disease of native coronary artery without angina pectoris: Secondary | ICD-10-CM | POA: Diagnosis not present

## 2023-02-24 DIAGNOSIS — E119 Type 2 diabetes mellitus without complications: Secondary | ICD-10-CM | POA: Diagnosis not present

## 2023-02-24 DIAGNOSIS — I119 Hypertensive heart disease without heart failure: Secondary | ICD-10-CM | POA: Diagnosis not present

## 2023-02-24 DIAGNOSIS — I351 Nonrheumatic aortic (valve) insufficiency: Secondary | ICD-10-CM | POA: Insufficient documentation

## 2023-02-24 LAB — ECHOCARDIOGRAM COMPLETE
Area-P 1/2: 3.31 cm2
S' Lateral: 2.3 cm
Single Plane A4C EF: 56.3 %

## 2023-02-24 NOTE — Progress Notes (Signed)
  Echocardiogram 2D Echocardiogram has been performed.  Pamela Barnett 02/24/2023, 12:51 PM

## 2023-03-03 ENCOUNTER — Ambulatory Visit: Payer: 59 | Attending: Cardiology | Admitting: Cardiology

## 2023-03-03 ENCOUNTER — Encounter: Payer: Self-pay | Admitting: Cardiology

## 2023-03-03 VITALS — BP 126/62 | HR 78 | Ht 60.0 in | Wt 121.1 lb

## 2023-03-03 DIAGNOSIS — I2510 Atherosclerotic heart disease of native coronary artery without angina pectoris: Secondary | ICD-10-CM

## 2023-03-03 DIAGNOSIS — Z951 Presence of aortocoronary bypass graft: Secondary | ICD-10-CM

## 2023-03-03 DIAGNOSIS — E785 Hyperlipidemia, unspecified: Secondary | ICD-10-CM | POA: Diagnosis not present

## 2023-03-03 DIAGNOSIS — Z7984 Long term (current) use of oral hypoglycemic drugs: Secondary | ICD-10-CM

## 2023-03-03 DIAGNOSIS — E1169 Type 2 diabetes mellitus with other specified complication: Secondary | ICD-10-CM | POA: Diagnosis not present

## 2023-03-03 DIAGNOSIS — I10 Essential (primary) hypertension: Secondary | ICD-10-CM | POA: Diagnosis not present

## 2023-03-03 NOTE — Patient Instructions (Signed)
Medication Instructions:  Your physician has recommended you make the following change in your medication:  1) STOP isosorbide mononitrate (Imdur)  *If you need a refill on your cardiac medications before your next appointment, please call your pharmacy*  Lab Work: None ordered today.  Testing/Procedures: None ordered today.  Follow-Up: At [facility], you and your health needs are our priority.  As part of our continuing mission to provide you with exceptional heart care, we have created designated Provider Care Teams.  These Care Teams include your primary Cardiologist (physician) and Advanced Practice Providers (APPs -  Physician Assistants and Nurse Practitioners) who all work together to provide you with the care you need, when you need it.  Your next appointment:   3 month(s)  The format for your next appointment:   In Person  Provider:   Parke Poisson, MD {

## 2023-03-03 NOTE — Progress Notes (Signed)
Cardiology Office Note:    Date:  03/03/2023   ID:  Pamela Barnett, DOB 1933-07-04, MRN [id number]  PCP:  Myrlene Broker, MD  Cardiologist:  Garwin Brothers, MD   Referring MD: Myrlene Broker, *    ASSESSMENT:    1. Coronary artery disease involving native coronary artery of native heart without angina pectoris   2. Primary hypertension   3. Hyperlipidemia associated with type 2 diabetes mellitus (HCC)   4. Hx of CABG    PLAN:    In order of problems listed above:  Coronary artery disease: Secondary prevention stressed to the patient.  Importance of compliance with diet medications stressed and she vocalized understanding.  She was advised to ambulate to the best of her ability. Essential hypertension: Blood pressure stable.  Her blood pressure drops with isosorbide so she will discontinue it.  She will keep track of her blood pressures.  If this continues then I will reduce amlodipine dose 1 step at a time.  Diet and salt intake issues were discussed. Mixed dyslipidemia: On lipid-lowering medications.  Lipids done 2 months ago were fine. Echo report discussed with the patient at length and questions were answered to satisfaction. Diabetes mellitus: Followed by primary care.  Diet emphasized. Patient requested transfer to another physician near her home and that has been accepted and the process is complete and she will follow-up with her colleague in the next 2 to 3 months.    Medication Adjustments/Labs and Tests Ordered: Current medicines are reviewed at length with the patient today.  Concerns regarding medicines are outlined above.  No orders of the defined types were placed in this encounter.  No orders of the defined types were placed in this encounter.    No chief complaint on file.    History of Present Illness:    Pamela Barnett is a 89 y.o. female.  Patient has past medical history of coronary artery disease, essential hypertension,  mixed dyslipidemia and diabetes mellitus.  She denies any problems at this time and takes care of activities of daily living.  No chest pain orthopnea or PND.  Her daughter who is a retired Sports administrator has brought her in for low blood pressures.  She mentions to me that after taking isosorbide her blood pressure gets low and then comes back up again and the same thing happens when she takes furosemide.  No chest pain orthopnea PND.  No syncope.  At the time of my evaluation, the patient is alert awake oriented and in no distress.  Past Medical History:  Diagnosis Date   Bilateral hearing loss 01/16/2022   CAD (coronary artery disease) 11/03/2021   Chronic pain of both knees 07/02/2021   Encounter for general adult medical examination with abnormal findings 07/01/2022   Hx of CABG 01/24/2021   Hyperlipidemia associated with type 2 diabetes mellitus (HCC) 12/20/2020   Normocytic anemia 01/20/2021   Pedal edema 01/15/2023   Primary hypertension 12/20/2020   Syncope 05/08/2022   Type 2 diabetes with complication (HCC) 01/23/2021    Past Surgical History:  Procedure Laterality Date   cataracts surgery  2011   EYE SURGERY     VEIN BYPASS SURGERY  1998    Current Medications: Current Meds  Medication Sig   Accu-Chek FastClix Lancets MISC USE AS DIRECTED   amLODipine (NORVASC) 5 MG tablet Take 5 mg by mouth every other day.   Ascorbic Acid (VITAMIN C) 100 MG tablet Take 100 mg by mouth daily.  aspirin EC 81 MG tablet Take 81 mg by mouth daily. Swallow whole.   atorvastatin (LIPITOR) 10 MG tablet TAKE 1 TABLET(10 MG) BY MOUTH DAILY   Blood Glucose Monitoring Suppl DEVI 1 each by Does not apply route daily. May substitute to any manufacturer covered by patient's insurance.   Cholecalciferol (VITAMIN D3) 125 MCG (5000 UT) CAPS Take 1 capsule (5,000 Units total) by mouth daily.   furosemide (LASIX) 40 MG tablet Take 30 mg by mouth daily.   Glucose Blood (BLOOD GLUCOSE TEST STRIPS) STRP 1 each  by In Vitro route daily. May substitute to any manufacturer covered by patient's insurance.   irbesartan (AVAPRO) 300 MG tablet Take 1 tablet (300 mg total) by mouth daily.   isosorbide mononitrate (IMDUR) 30 MG 24 hr tablet Take 30 mg by mouth daily.   Krill Oil 350 MG CAPS Take 350 mg by mouth daily.   Lancet Device MISC 1 each by Does not apply route daily. May substitute to any manufacturer covered by patient's insurance.   Lancets Misc. MISC 1 each by Does not apply route daily. May substitute to any manufacturer covered by patient's insurance.   metFORMIN (GLUCOPHAGE) 1000 MG tablet Take 1 tablet (1,000 mg total) by mouth 2 (two) times daily with a meal.   Multiple Vitamins-Minerals (IMMUNE SUPPORT VITAMIN C) PACK Take 1 tablet by mouth daily. Airborne   nitroGLYCERIN (NITROSTAT) 0.4 MG SL tablet Place 1 tablet (0.4 mg total) under the tongue every 5 (five) minutes as needed for chest pain.     Allergies:   Penicillins   Social History   Socioeconomic History   Marital status: Widowed    Spouse name: Not on file   Number of children: Not on file   Years of education: Not on file   Highest education level: Not on file  Occupational History   Not on file  Tobacco Use   Smoking status: Never   Smokeless tobacco: Never  Substance and Sexual Activity   Alcohol use: Never   Drug use: Never   Sexual activity: Not on file  Other Topics Concern   Not on file  Social History Narrative   Not on file   Social Determinants of Health   Financial Resource Strain: Not on file  Food Insecurity: Not on file  Transportation Needs: Not on file  Physical Activity: Not on file  Stress: Not on file  Social Connections: Unknown (12/09/2021)   Received from [facility]   Social Network    Social Network: Not on file     Family History: The patient's family history includes Diabetes in her daughter; Myocarditis in her father.  ROS:   Please see the history of present illness.    All  other systems reviewed and are negative.  EKGs/Labs/Other Studies Reviewed:    The following studies were reviewed today: I discussed my findings with the patient at length.   Recent Labs: 06/30/2022: Hemoglobin 10.0; Platelets 288.0 12/28/2022: ALT 19 01/19/2023: BUN 24; Creatinine, Ser 0.98; Potassium 4.4; Sodium 140  Recent Lipid Panel    Component Value Date/Time   CHOL 141 12/28/2022 1014   TRIG 91.0 12/28/2022 1014   HDL 56.30 12/28/2022 1014   CHOLHDL 3 12/28/2022 1014   VLDL 18.2 12/28/2022 1014   LDLCALC 67 12/28/2022 1014    Physical Exam:    VS:  BP 126/62   Pulse 78   Ht 5' (1.524 m)   Wt 121 lb 1.3 oz (54.9 kg)  SpO2 97%   BMI 23.65 kg/m     Wt Readings from Last 3 Encounters:  03/03/23 121 lb 1.3 oz (54.9 kg)  01/19/23 122 lb (55.3 kg)  01/15/23 122 lb 0.6 oz (55.4 kg)     GEN: Patient is in no acute distress HEENT: Normal NECK: No JVD; No carotid bruits LYMPHATICS: No lymphadenopathy CARDIAC: Hear sounds regular, 2/6 systolic murmur at the apex. RESPIRATORY:  Clear to auscultation without rales, wheezing or rhonchi  ABDOMEN: Soft, non-tender, non-distended MUSCULOSKELETAL:  No edema; No deformity  SKIN: Warm and dry NEUROLOGIC:  Alert and oriented x 3 PSYCHIATRIC:  Normal affect   Signed, Garwin Brothers, MD  03/03/2023 10:05 AM    [facility]

## 2023-05-10 ENCOUNTER — Encounter: Payer: Self-pay | Admitting: Cardiology

## 2023-05-10 ENCOUNTER — Encounter: Payer: Self-pay | Admitting: Internal Medicine

## 2023-06-03 ENCOUNTER — Ambulatory Visit: Payer: 59 | Admitting: Cardiology

## 2023-07-16 ENCOUNTER — Other Ambulatory Visit: Payer: Self-pay | Admitting: Internal Medicine

## 2023-07-16 ENCOUNTER — Other Ambulatory Visit: Payer: Self-pay

## 2023-07-16 MED ORDER — AMLODIPINE BESYLATE 5 MG PO TABS: 5.0000 mg | ORAL_TABLET | ORAL | 0 refills | Status: DC

## 2023-08-17 ENCOUNTER — Ambulatory Visit: Payer: 59 | Attending: Cardiology | Admitting: Internal Medicine

## 2023-08-17 ENCOUNTER — Encounter: Payer: Self-pay | Admitting: Internal Medicine

## 2023-08-17 VITALS — BP 138/58 | HR 74 | Ht 60.0 in | Wt 124.0 lb

## 2023-08-17 DIAGNOSIS — E785 Hyperlipidemia, unspecified: Secondary | ICD-10-CM | POA: Diagnosis not present

## 2023-08-17 DIAGNOSIS — Z951 Presence of aortocoronary bypass graft: Secondary | ICD-10-CM | POA: Diagnosis not present

## 2023-08-17 DIAGNOSIS — E118 Type 2 diabetes mellitus with unspecified complications: Secondary | ICD-10-CM

## 2023-08-17 DIAGNOSIS — I6529 Occlusion and stenosis of unspecified carotid artery: Secondary | ICD-10-CM

## 2023-08-17 DIAGNOSIS — E1169 Type 2 diabetes mellitus with other specified complication: Secondary | ICD-10-CM | POA: Diagnosis not present

## 2023-08-17 DIAGNOSIS — I2510 Atherosclerotic heart disease of native coronary artery without angina pectoris: Secondary | ICD-10-CM | POA: Diagnosis not present

## 2023-08-17 DIAGNOSIS — I10 Essential (primary) hypertension: Secondary | ICD-10-CM

## 2023-08-17 NOTE — Patient Instructions (Signed)
Medication Instructions:  Your physician recommends that you continue on your current medications as directed. Please refer to the Current Medication list given to you today.   *If you need a refill on your cardiac medications before your next appointment, please call your pharmacy*   Lab Work: NONE    If you have labs (blood work) drawn today and your tests are completely normal, you will receive your results only by: MyChart Message (if you have MyChart) OR A paper copy in the mail If you have any lab test that is abnormal or we need to change your treatment, we will call you to review the results.   Testing/Procedures: Your physician has requested that you have a carotid duplex. This test is an ultrasound of the carotid arteries in your neck. It looks at blood flow through these arteries that supply the brain with blood. Allow one hour for this exam. There are no restrictions or special instructions.    Follow-Up: At [facility], you and your health needs are our priority.  As part of our continuing mission to provide you with exceptional heart care, we have created designated Provider Care Teams.  These Care Teams include your primary Cardiologist (physician) and Advanced Practice Providers (APPs -  Physician Assistants and Nurse Practitioners) who all work together to provide you with the care you need, when you need it.  We recommend signing up for the patient portal called "MyChart".  Sign up information is provided on this After Visit Summary.  MyChart is used to connect with patients for Virtual Visits (Telemedicine).  Patients are able to view lab/test results, encounter notes, upcoming appointments, etc.  Non-urgent messages can be sent to your provider as well.   To learn more about what you can do with MyChart, go to [email or web address].    Your next appointment:   6 month(s)  The format for your next appointment:   In Person  Provider:   Parke Poisson, MD     Other Instructions

## 2023-08-17 NOTE — Progress Notes (Signed)
Cardiology Office Note:  .   Date:  08/17/2023  ID:  Pamela Barnett, DOB [birth year], MRN [id number] PCP: Myrlene Broker, MD  [facility] Providers Cardiologist:  Parke Poisson, MD    History of Present Illness: .   Pamela Barnett is a 89 y.o. female.  Discussed the use of AI scribe software for clinical note transcription with the patient, who gave verbal consent to proceed.  History of Present Illness   The patient, with a history of coronary artery disease, prior CABG, hypertension, hyperlipidemia, and diabetes mellitus type 2, presents with concerns about high blood pressure in the morning. The patient reports feeling shaky and unstable when walking during these episodes of high blood pressure. The patient and family have been managing the blood pressure at home, adjusting medications based on the blood pressure readings. The patient takes isosorbide when the blood pressure is high. She did not discontinue this entirely after her last visit with Dr. Tomie China. Also takes Lasix when there is edema. The patient also reports that the blood pressure tends to be high in the morning, often reaching 170-180, and then decreases to around 140 after taking isosorbide. The patient has noticed that the blood pressure can drop to around 110 or even 98 after taking Lasix, which is too low. The patient also has a history of carotid artery plaque and has been monitoring this condition.        ROS: negative except per HPI above.  Studies Reviewed: Marland Kitchen   EKG Interpretation Date/Time:  Tuesday August 17 2023 09:04:16 EST Ventricular Rate:  74 PR Interval:  200 QRS Duration:  68 QT Interval:  394 QTC Calculation: 437 R Axis:   20  Text Interpretation: Sinus rhythm with PACs Cannot rule out Anterior infarct , age undetermined Confirmed by Weston Brass ([id number]) on 08/17/2023 9:29:58 AM    Results   LABS LDL: 67 (02/2023) A1c: 7.9 Hb: 10  DIAGNOSTIC EKG: Normal  rhythm, occasional PACs, old anterior infarct (08/17/2023) Echocardiogram: Normal ejection fraction, no wall motion abnormalities, normal pressures, normal heart valves (02/2023)     Risk Assessment/Calculations:             Physical Exam:   VS:  BP (!) 138/58 (BP Location: Left Arm, Patient Position: Sitting, Cuff Size: Normal)   Pulse 74   Ht 5' (1.524 m)   Wt 124 lb (56.2 kg)   SpO2 97%   BMI 24.22 kg/m    Wt Readings from Last 3 Encounters:  08/17/23 124 lb (56.2 kg)  03/03/23 121 lb 1.3 oz (54.9 kg)  01/19/23 122 lb (55.3 kg)     Physical Exam   VITALS: BP- 138/58 CHEST: Lungs clear to auscultation. CARDIOVASCULAR: Occasional premature atrial contractions. 1/6 SEM.     GEN: Well nourished, well developed in no acute distress NECK: No JVD; No carotid bruits CARDIAC: RRR, no murmurs, rubs, gallops RESPIRATORY:  Clear to auscultation without rales, wheezing or rhonchi  ABDOMEN: Soft, non-tender, non-distended EXTREMITIES:  No edema; No deformity   ASSESSMENT AND PLAN: .    Assessment & Plan Primary hypertension  Carotid atherosclerosis, unspecified laterality  Hyperlipidemia associated with type 2 diabetes mellitus (HCC)  Coronary artery disease involving native coronary artery of native heart without angina pectoris  Hx of CABG  Type 2 diabetes with complication (HCC)   Assessment and Plan    Hypertension Morning blood pressures elevated (160-180s), but patient experiences symptomatic hypotension with Lasix. Patient self-adjusts isosorbide and Lasix based on home  blood pressure readings. -Continue current regimen with self-adjustment as tolerated. -Aim for blood pressures in the 130s-140s. -Avoid excessive salt intake, especially on days when morning blood pressure is high.  Coronary Artery Disease Stable with LDL controlled at 67. -Continue atorvastatin 10mg  daily and baby aspirin.  Diabetes Mellitus Type 2 A1c 7.9, managed by primary care. -No  changes recommended at this time.  Aortic Valve Sclerosis Detected on physical exam, no significant findings on echocardiogram. -No intervention needed at this time. -Will monitor for changes in murmur or symptoms.  Carotid Artery Plaque History of plaque noted, no recent imaging. -Order carotid artery ultrasound to follow given report of prior plaque.  Follow-up -Return in 6 months unless blood pressure issues arise. -Will communicate results of carotid ultrasound when available.

## 2023-09-08 ENCOUNTER — Inpatient Hospital Stay (HOSPITAL_COMMUNITY): Admission: RE | Admit: 2023-09-08 | Payer: 59 | Source: Ambulatory Visit

## 2023-09-08 ENCOUNTER — Ambulatory Visit (HOSPITAL_COMMUNITY)
Admission: RE | Admit: 2023-09-08 | Discharge: 2023-09-08 | Disposition: A | Discharge status: Home / Self Care | Payer: 59 | Source: Ambulatory Visit | Attending: Internal Medicine | Admitting: Internal Medicine

## 2023-09-08 DIAGNOSIS — I6529 Occlusion and stenosis of unspecified carotid artery: Secondary | ICD-10-CM | POA: Insufficient documentation

## 2023-09-08 DIAGNOSIS — I6523 Occlusion and stenosis of bilateral carotid arteries: Secondary | ICD-10-CM | POA: Insufficient documentation

## 2023-09-09 ENCOUNTER — Other Ambulatory Visit (HOSPITAL_COMMUNITY): Payer: Self-pay

## 2023-09-09 DIAGNOSIS — I6529 Occlusion and stenosis of unspecified carotid artery: Secondary | ICD-10-CM

## 2023-10-14 ENCOUNTER — Encounter: Payer: Self-pay | Admitting: Internal Medicine

## 2023-11-04 ENCOUNTER — Encounter: Payer: Self-pay | Admitting: Internal Medicine

## 2023-11-04 ENCOUNTER — Ambulatory Visit (INDEPENDENT_AMBULATORY_CARE_PROVIDER_SITE_OTHER): Admitting: Internal Medicine

## 2023-11-04 VITALS — BP 122/88 | HR 76 | Temp 97.6°F | Ht 60.0 in | Wt 117.0 lb

## 2023-11-04 DIAGNOSIS — E785 Hyperlipidemia, unspecified: Secondary | ICD-10-CM

## 2023-11-04 DIAGNOSIS — Z951 Presence of aortocoronary bypass graft: Secondary | ICD-10-CM | POA: Diagnosis not present

## 2023-11-04 DIAGNOSIS — Z0001 Encounter for general adult medical examination with abnormal findings: Secondary | ICD-10-CM

## 2023-11-04 DIAGNOSIS — E118 Type 2 diabetes mellitus with unspecified complications: Secondary | ICD-10-CM

## 2023-11-04 DIAGNOSIS — Z0000 Encounter for general adult medical examination without abnormal findings: Secondary | ICD-10-CM

## 2023-11-04 DIAGNOSIS — I10 Essential (primary) hypertension: Secondary | ICD-10-CM | POA: Diagnosis not present

## 2023-11-04 DIAGNOSIS — I25118 Atherosclerotic heart disease of native coronary artery with other forms of angina pectoris: Secondary | ICD-10-CM

## 2023-11-04 DIAGNOSIS — E1169 Type 2 diabetes mellitus with other specified complication: Secondary | ICD-10-CM

## 2023-11-04 DIAGNOSIS — Z7984 Long term (current) use of oral hypoglycemic drugs: Secondary | ICD-10-CM

## 2023-11-04 DIAGNOSIS — E119 Type 2 diabetes mellitus without complications: Secondary | ICD-10-CM

## 2023-11-04 LAB — CBC
HCT: 35.8 % — ABNORMAL LOW (ref 36.0–46.0)
Hemoglobin: 11.6 g/dL — ABNORMAL LOW (ref 12.0–15.0)
MCHC: 32.5 g/dL (ref 30.0–36.0)
MCV: 91.1 fl (ref 78.0–100.0)
Platelets: 259 10*3/uL (ref 150.0–400.0)
RBC: 3.93 Mil/uL (ref 3.87–5.11)
RDW: 13.9 % (ref 11.5–15.5)
WBC: 6.9 10*3/uL (ref 4.0–10.5)

## 2023-11-04 LAB — COMPREHENSIVE METABOLIC PANEL WITH GFR
ALT: 19 U/L (ref 0–35)
AST: 21 U/L (ref 0–37)
Albumin: 4.2 g/dL (ref 3.5–5.2)
Alkaline Phosphatase: 65 U/L (ref 39–117)
BUN: 20 mg/dL (ref 6–23)
CO2: 30 meq/L (ref 19–32)
Calcium: 9.4 mg/dL (ref 8.4–10.5)
Chloride: 102 meq/L (ref 96–112)
Creatinine, Ser: 0.84 mg/dL (ref 0.40–1.20)
GFR: 61.81 mL/min (ref >60.00)
Glucose, Bld: 125 mg/dL — ABNORMAL HIGH (ref 70–99)
Potassium: 4 meq/L (ref 3.5–5.1)
Sodium: 138 meq/L (ref 135–145)
Total Bilirubin: 0.4 mg/dL (ref 0.2–1.2)
Total Protein: 6.9 g/dL (ref 6.0–8.3)

## 2023-11-04 LAB — MICROALBUMIN / CREATININE URINE RATIO
Creatinine,U: 204.4 mg/dL
Microalb Creat Ratio: 204.9 mg/g — ABNORMAL HIGH (ref 0.0–30.0)
Microalb, Ur: 41.9 mg/dL — ABNORMAL HIGH (ref 0.0–1.9)

## 2023-11-04 LAB — LIPID PANEL
Cholesterol: 146 mg/dL (ref 0–200)
HDL: 60.5 mg/dL (ref >39.00)
LDL Cholesterol: 66 mg/dL (ref 0–99)
NonHDL: 85.26
Total CHOL/HDL Ratio: 2
Triglycerides: 97 mg/dL (ref 0.0–149.0)
VLDL: 19.4 mg/dL (ref 0.0–40.0)

## 2023-11-04 LAB — HEMOGLOBIN A1C: Hgb A1c MFr Bld: 8 % — ABNORMAL HIGH (ref 4.6–6.5)

## 2023-11-04 MED ORDER — NITROGLYCERIN 0.4 MG SL SUBL: 0.4000 mg | SUBLINGUAL_TABLET | SUBLINGUAL | 2 refills | Status: AC | PRN

## 2023-11-04 MED ORDER — AMLODIPINE BESYLATE 5 MG PO TABS: 5.0000 mg | ORAL_TABLET | ORAL | 0 refills | Status: DC

## 2023-11-04 MED ORDER — FAMOTIDINE 40 MG PO TABS: 40.0000 mg | ORAL_TABLET | Freq: Every day | ORAL | 3 refills | Status: AC

## 2023-11-04 MED ORDER — IRBESARTAN 300 MG PO TABS: 300.0000 mg | ORAL_TABLET | Freq: Every day | ORAL | 3 refills | Status: AC

## 2023-11-04 MED ORDER — ATORVASTATIN CALCIUM 10 MG PO TABS: 10.0000 mg | ORAL_TABLET | Freq: Every day | ORAL | 3 refills | Status: AC

## 2023-11-04 MED ORDER — METFORMIN HCL 1000 MG PO TABS: 1000.0000 mg | ORAL_TABLET | Freq: Two times a day (BID) | ORAL | 3 refills | Status: AC

## 2023-11-04 NOTE — Assessment & Plan Note (Signed)
 With stable angina on imdur 30 mg daily. Taking aspirin and statin. BP at goal.

## 2023-11-04 NOTE — Assessment & Plan Note (Signed)
 Chronic stable angina on imdur 30 mg daily. Taking lipitor and BP at goal.

## 2023-11-04 NOTE — Assessment & Plan Note (Signed)
 BP at goal checking CMP and adjust as needed. Taking amlodipine and irbesartan and imdur.

## 2023-11-04 NOTE — Assessment & Plan Note (Signed)
 Flu shot yearly. Pneumonia counseled. Shingrix counseled. Tetanus counseled. Colonoscopy aged out. Mammogram aged out, pap smear aged out and dexa complete. Counseled about sun safety and mole surveillance. Counseled about the dangers of distracted driving. Given 10 year screening recommendations.

## 2023-11-04 NOTE — Progress Notes (Signed)
   Subjective:   Patient ID: Pamela Barnett, female    DOB: 1933/01/10, 89 y.o.   MRN: [id number]  HPI The patient is here for physical.  PMH, [facility], social history reviewed and updated  Review of Systems  Constitutional: Negative.   HENT: Negative.    Eyes: Negative.   Respiratory:  Negative for cough, chest tightness and shortness of breath.   Cardiovascular:  Negative for chest pain, palpitations and leg swelling.  Gastrointestinal:  Negative for abdominal distention, abdominal pain, constipation, diarrhea, nausea and vomiting.       GERD  Musculoskeletal:  Positive for arthralgias.  Skin: Negative.   Neurological: Negative.   Psychiatric/Behavioral: Negative.      Objective:  Physical Exam Constitutional:      Appearance: She is well-developed.  HENT:     Head: Normocephalic and atraumatic.  Cardiovascular:     Rate and Rhythm: Normal rate and regular rhythm.  Pulmonary:     Effort: Pulmonary effort is normal. No respiratory distress.     Breath sounds: Normal breath sounds. No wheezing or rales.  Abdominal:     General: Bowel sounds are normal. There is no distension.     Palpations: Abdomen is soft.     Tenderness: There is no abdominal tenderness. There is no rebound.  Musculoskeletal:        General: Tenderness present.     Cervical back: Normal range of motion.  Skin:    General: Skin is warm and dry.     Comments: Foot exam done  Neurological:     Mental Status: She is alert and oriented to person, place, and time.     Coordination: Coordination abnormal.     Vitals:   11/04/23 1004  BP: 122/88  Pulse: 76  Temp: 97.6 F (36.4 C)  TempSrc: Oral  SpO2: 96%  Weight: 117 lb (53.1 kg)  Height: 5' (1.524 m)    Assessment & Plan:

## 2023-11-04 NOTE — Assessment & Plan Note (Signed)
Checking lipid panel and adjust lipitor as needed.  

## 2023-11-04 NOTE — Assessment & Plan Note (Signed)
 Foot exam done. Checking lipid, HgA1c, UACR, CMP and adjust as needed. Taking metformin 1000 mg BID and adjust as needed.

## 2023-11-09 ENCOUNTER — Encounter: Payer: Self-pay | Admitting: Internal Medicine

## 2023-11-22 DIAGNOSIS — H35362 Drusen (degenerative) of macula, left eye: Secondary | ICD-10-CM | POA: Diagnosis not present

## 2023-11-22 DIAGNOSIS — H353131 Nonexudative age-related macular degeneration, bilateral, early dry stage: Secondary | ICD-10-CM | POA: Diagnosis not present

## 2023-11-22 DIAGNOSIS — H35031 Hypertensive retinopathy, right eye: Secondary | ICD-10-CM | POA: Diagnosis not present

## 2023-11-22 DIAGNOSIS — E119 Type 2 diabetes mellitus without complications: Secondary | ICD-10-CM | POA: Diagnosis not present

## 2023-11-22 DIAGNOSIS — H524 Presbyopia: Secondary | ICD-10-CM | POA: Diagnosis not present

## 2023-11-22 LAB — HM DIABETES EYE EXAM

## 2023-12-27 ENCOUNTER — Encounter: Payer: Self-pay | Admitting: Internal Medicine

## 2024-01-13 ENCOUNTER — Other Ambulatory Visit: Payer: Self-pay | Admitting: Internal Medicine

## 2024-01-25 ENCOUNTER — Other Ambulatory Visit: Payer: Self-pay | Admitting: Internal Medicine

## 2024-02-15 ENCOUNTER — Ambulatory Visit (INDEPENDENT_AMBULATORY_CARE_PROVIDER_SITE_OTHER): Payer: 59 | Admitting: Podiatry

## 2024-02-15 ENCOUNTER — Encounter: Payer: Self-pay | Admitting: Podiatry

## 2024-02-15 DIAGNOSIS — E118 Type 2 diabetes mellitus with unspecified complications: Secondary | ICD-10-CM | POA: Diagnosis not present

## 2024-02-15 DIAGNOSIS — B351 Tinea unguium: Secondary | ICD-10-CM | POA: Diagnosis not present

## 2024-02-15 NOTE — Addendum Note (Signed)
 Addended by: WAYLAN ELIDIA PARAS on: 02/15/2024 04:43 PM   Modules accepted: Orders

## 2024-02-15 NOTE — Progress Notes (Addendum)
  Subjective:  Patient ID: Pamela Barnett, female    DOB: [birth year],   MRN: [id number]  Chief Complaint  Patient presents with   Diabetes    She comes because she is Diabetic.  She has some foot problems.    89 y.o. female presents for diabetic foot check. Relates some swelling in legs and wondering if this is a concern.   Relates burning and tingling in their feet. Patient is diabetic and last A1c was  Lab Results  Component Value Date   HGBA1C 8.0 (H) 11/04/2023   .   PCP:  Rollene Almarie LABOR, MD    . Denies any other pedal complaints. Denies n/v/f/c.   Past Medical History:  Diagnosis Date   Allergy    Anxiety    Arthritis    Bilateral hearing loss 01/16/2022   CAD (coronary artery disease) 11/03/2021   Chronic pain of both knees 07/02/2021   Encounter for general adult medical examination with abnormal findings 07/01/2022   Hx of CABG 01/24/2021   Hyperlipidemia associated with type 2 diabetes mellitus (HCC) 12/20/2020   Neuromuscular disorder (HCC)    Normocytic anemia 01/20/2021   Pedal edema 01/15/2023   Primary hypertension 12/20/2020   Syncope 05/08/2022   Type 2 diabetes with complication (HCC) 01/23/2021    Objective:  Physical Exam: Vascular: DP/PT pulses 2/4 bilateral. CFT <3 seconds. Absent hair growth on digits. Edema noted to bilateral lower extremities. Xerosis noted bilaterally mild.  Skin. No lacerations or abrasions bilateral feet. Nails 1-5 bilateral  are normal in appearance. No discolorations noted today.  Musculoskeletal: MMT 5/5 bilateral lower extremities in DF, PF, Inversion and Eversion. Deceased ROM in DF of ankle joint.  Neurological: Sensation intact to light touch. Protective sensation intact bilateral.    Assessment:   1. Type 2 diabetes with complication [facility])       Plan:  Patient was evaluated and treated and all questions answered. X-rays reviewed and discussed with patient. No acute fractures or dislocations noted.    Pes cavus noted with some degenerative changes noted in ankle joint and midfoot joints. Possible age indeterminate fracture of the distal fifth metatarsal head.  -Discussed and educated patient on diabetic foot care, especially with  regards to the vascular, neurological and musculoskeletal systems.  -Stressed the importance of good glycemic control and the detriment of not  controlling glucose levels in relation to the foot. -Discussed supportive shoes at all times and checking feet regularly.  -Discussed discoloration could have been some bruising from an injury but appears to be resolved today and no major concerns.  -Answered all patient questions -Patient to return  in 1 year for DM foot check.  -Patient advised to call the office if any problems or questions arise in the meantime.   Asberry Failing, DPM

## 2024-02-15 NOTE — Addendum Note (Signed)
 Addended by: WAYLAN ELIDIA PARAS on: 02/15/2024 04:59 PM   Modules accepted: Orders

## 2024-03-24 ENCOUNTER — Encounter: Payer: Self-pay | Admitting: Internal Medicine

## 2024-03-29 ENCOUNTER — Encounter: Payer: Self-pay | Admitting: Internal Medicine

## 2024-03-30 ENCOUNTER — Encounter: Payer: Self-pay | Admitting: *Deleted

## 2024-03-30 NOTE — Telephone Encounter (Signed)
 Per pt, not urgent, only wanted to see MD. Scheduled 11/12 and added to waitlist

## 2024-03-30 NOTE — Telephone Encounter (Signed)
 Found pt a slot for 9/8 at 8:20

## 2024-03-31 ENCOUNTER — Ambulatory Visit

## 2024-03-31 VITALS — BP 122/70 | HR 65 | Wt 121.2 lb

## 2024-03-31 DIAGNOSIS — Z0000 Encounter for general adult medical examination without abnormal findings: Secondary | ICD-10-CM

## 2024-03-31 NOTE — Patient Instructions (Signed)
 Ms. Demarais,  Thank you for taking the time for your Medicare Wellness Visit. I appreciate your continued commitment to your health goals. Please review the care plan we discussed, and feel free to reach out if I can assist you further.  Medicare recommends these wellness visits once per year to help you and your care team stay ahead of potential health issues. These visits are designed to focus on prevention, allowing your provider to concentrate on managing your acute and chronic conditions during your regular appointments.  Please note that Annual Wellness Visits do not include a physical exam. Some assessments may be limited, especially if the visit was conducted virtually. If needed, we may recommend a separate in-person follow-up with your provider.  Ongoing Care Seeing your primary care provider every 3 to 6 months helps us  monitor your health and provide consistent, personalized care. Last office visit on 11/04/2023.    Referrals If a referral was made during today's visit and you haven't received any updates within two weeks, please contact the referred provider directly to check on the status.  Recommended Screenings:  Health Maintenance  Topic Date Due   DTaP/Tdap/Td vaccine (1 - Tdap) Never done   Pneumococcal Vaccine for age over 45 (1 of 2 - PCV) Never done   Zoster (Shingles) Vaccine (1 of 2) Never done   Medicare Annual Wellness Visit  07/01/2023   Flu Shot  02/25/2024   COVID-19 Vaccine (4 - 2025-26 season) 03/27/2024   Hemoglobin A1C  05/05/2024   Eye exam for diabetics  11/21/2024   Complete foot exam   02/14/2025   DEXA scan (bone density measurement)  Completed   HPV Vaccine  Aged Out   Meningitis B Vaccine  Aged Out       03/31/2024    3:18 PM  Advanced Directives  Does Patient Have a Medical Advance Directive? Yes  Type of Estate agent of [place];Living will  Copy of Healthcare Power of Attorney in Chart? No - copy requested    Advance Care Planning is important because it: Ensures you receive medical care that aligns with your values, goals, and preferences. Provides guidance to your family and loved ones, reducing the emotional burden of decision-making during critical moments.  Vision: Annual vision screenings are recommended for early detection of glaucoma, cataracts, and diabetic retinopathy. These exams can also reveal signs of chronic conditions such as diabetes and high blood pressure.  Dental: Annual dental screenings help detect early signs of oral cancer, gum disease, and other conditions linked to overall health, including heart disease and diabetes.  Please see the attached documents for additional preventive care recommendations. You are due for a Flu vaccine and a pneumonia vaccine.  You can get at your pharmacy or get here during your next office visit.  Remember to bring in your glucose monitor at your next office visit.

## 2024-03-31 NOTE — Progress Notes (Signed)
 Subjective:   Pamela Barnett is a 89 y.o. who presents for a Medicare Wellness preventive visit.  As a reminder, Annual Wellness Visits don't include a physical exam, and some assessments may be limited, especially if this visit is performed virtually. We may recommend an in-person follow-up visit with your provider if needed.  Visit Complete: In person   Persons Participating in Visit: Patient assisted by daughter.  AWV Questionnaire: Yes: Patient Medicare AWV questionnaire was completed by the patient on 03/30/2024; I have confirmed that all information answered by patient is correct and no changes since this date.  Cardiac Risk Factors include: advanced age (>70men, >30 women);hypertension;diabetes mellitus;Other (see comment), Risk factor comments: CAD     Objective:    Today's Vitals   03/31/24 1558  BP: 122/70  Pulse: 65  SpO2: 99%  Weight: 121 lb 3.2 oz (55 kg)   Body mass index is 23.67 kg/m.     03/31/2024    3:18 PM  Advanced Directives  Does Patient Have a Medical Advance Directive? Yes  Type of Estate agent of [place];Living will  Copy of Healthcare Power of Attorney in Chart? No - copy requested    Current Medications (verified) Outpatient Encounter Medications as of 03/31/2024  Medication Sig   ACCU-CHEK GUIDE TEST test strip USE 1 EACH BY IN VITRO ROUTE DAILY   Accu-Chek Softclix Lancets lancets USE 1 EACH BY DOES NOT APPLY ROUTE DAILY   amLODipine  (NORVASC ) 5 MG tablet Take 1 tablet (5 mg total) by mouth every other day.   Ascorbic Acid (VITAMIN C) 100 MG tablet Take 100 mg by mouth daily.   aspirin EC 81 MG tablet Take 81 mg by mouth daily. Swallow whole.   atorvastatin  (LIPITOR) 10 MG tablet Take 1 tablet (10 mg total) by mouth daily.   Blood Glucose Monitoring Suppl DEVI 1 each by Does not apply route daily. May substitute to any manufacturer covered by patient's insurance.   Cholecalciferol (VITAMIN D3) 125 MCG (5000 UT)  CAPS Take 1 capsule (5,000 Units total) by mouth daily.   famotidine  (PEPCID ) 40 MG tablet Take 1 tablet (40 mg total) by mouth daily.   furosemide  (LASIX ) 40 MG tablet Take 30 mg by mouth daily.   irbesartan  (AVAPRO ) 300 MG tablet Take 1 tablet (300 mg total) by mouth daily.   isosorbide  mononitrate (IMDUR ) 30 MG 24 hr tablet Take 30 mg by mouth daily.   Krill Oil 350 MG CAPS Take 350 mg by mouth daily.   Lancet Device MISC 1 each by Does not apply route daily. May substitute to any manufacturer covered by patient's insurance.   Lancets Misc. (ACCU-CHEK SOFTCLIX LANCET DEV) KIT USE 1 EACH BY DOES NOT APPLY ROUTE DAILY   metFORMIN  (GLUCOPHAGE ) 1000 MG tablet Take 1 tablet (1,000 mg total) by mouth 2 (two) times daily with a meal.   Multiple Vitamins-Minerals (IMMUNE SUPPORT VITAMIN C) PACK Take 1 tablet by mouth daily. Airborne   nitroGLYCERIN  (NITROSTAT ) 0.4 MG SL tablet Place 1 tablet (0.4 mg total) under the tongue every 5 (five) minutes as needed for chest pain.   Lancets Misc. MISC 1 each by Does not apply route daily. May substitute to any manufacturer covered by patient's insurance.   No facility-administered encounter medications on file as of 03/31/2024.    Allergies (verified) Penicillins   History: Past Medical History:  Diagnosis Date   Allergy    Anxiety    Arthritis    Bilateral hearing loss 01/16/2022  CAD (coronary artery disease) 11/03/2021   Chronic pain of both knees 07/02/2021   Encounter for general adult medical examination with abnormal findings 07/01/2022   Hx of CABG 01/24/2021   Hyperlipidemia associated with type 2 diabetes mellitus (HCC) 12/20/2020   Neuromuscular disorder (HCC)    Normocytic anemia 01/20/2021   Pedal edema 01/15/2023   Primary hypertension 12/20/2020   Syncope 05/08/2022   Type 2 diabetes with complication (HCC) 01/23/2021   Past Surgical History:  Procedure Laterality Date   cataracts surgery  2011   CHOLECYSTECTOMY     CORONARY  ARTERY BYPASS GRAFT     EYE SURGERY     VEIN BYPASS SURGERY  1998   Family History  Problem Relation Age of Onset   Myocarditis Father    Diabetes Daughter    Diabetes Brother    Social History   Socioeconomic History   Marital status: Widowed    Spouse name: Not on file   Number of children: Not on file   Years of education: Not on file   Highest education level: 12th grade  Occupational History   Occupation: RETIRED  Tobacco Use   Smoking status: Never   Smokeless tobacco: Never  Vaping Use   Vaping status: Never Used  Substance and Sexual Activity   Alcohol use: Never   Drug use: Never   Sexual activity: Not Currently  Other Topics Concern   Not on file  Social History Narrative   Lives with daughter/2025   Social Drivers of Health   Financial Resource Strain: Low Risk  (03/30/2024)   Overall Financial Resource Strain (CARDIA)    Difficulty of Paying Living Expenses: Not hard at all  Food Insecurity: No Food Insecurity (03/30/2024)   Hunger Vital Sign    Worried About Running Out of Food in the Last Year: Never true    Ran Out of Food in the Last Year: Never true  Transportation Needs: No Transportation Needs (03/30/2024)   PRAPARE - Administrator, Civil Service (Medical): No    Lack of Transportation (Non-Medical): No  Physical Activity: Inactive (03/31/2024)   Exercise Vital Sign    Days of Exercise per Week: 0 days    Minutes of Exercise per Session: 0 min  Stress: No Stress Concern Present (03/30/2024)   [employer] of Occupational Health - Occupational Stress Questionnaire    Feeling of Stress: Not at all  Social Connections: Moderately Isolated (03/30/2024)   Social Connection and Isolation Panel    Frequency of Communication with Friends and Family: More than three times a week    Frequency of Social Gatherings with Friends and Family: More than three times a week    Attends Religious Services: 1 to 4 times per year    Active Member of [employer]  or Organizations: No    Attends Banker Meetings: Not on file    Marital Status: Widowed    Tobacco Counseling Counseling given: Not Answered    Clinical Intake:  Pre-visit preparation completed: Yes  Pain : No/denies pain     Nutritional Risks: Nausea/ vomitting/ diarrhea (diarrhea) Diabetes: Yes CBG done?: Yes (121 per pt) CBG resulted in Enter/ Edit results?: No Did pt. bring in CBG monitor from home?: No  Lab Results  Component Value Date   HGBA1C 8.0 (H) 11/04/2023   HGBA1C 7.9 (H) 12/28/2022   HGBA1C 8.3 (H) 06/30/2022     How often do you need to have someone help you when you read  instructions, pamphlets, or other written materials from your doctor or pharmacy?: 4 - Often (daughters help)  Interpreter Needed?: No  Information entered by :: Ashlin Hidalgo, RMA   Activities of Daily Living     03/30/2024    8:44 AM  In your present state of health, do you have any difficulty performing the following activities:  Hearing? 1  Vision? 0  Difficulty concentrating or making decisions? 0  Walking or climbing stairs? 1  Dressing or bathing? 0  Doing errands, shopping? 1  Preparing Food and eating ? Y  Using the Toilet? N  In the past six months, have you accidently leaked urine? Y  Do you have problems with loss of bowel control? N  Managing your Medications? N  Managing your Finances? N  Housekeeping or managing your Housekeeping? Y    Patient Care Team: Rollene Almarie LABOR, MD as PCP - General (Internal Medicine) Loni Soyla LABOR, MD as PCP - Cardiology (Cardiology) Fleeta Zerita DASEN, MD as Consulting Physician (Ophthalmology)  I have updated your Care Teams any recent Medical Services you may have received from other providers in the past year.     Assessment:   This is a routine wellness examination for Pamela Barnett.  Hearing/Vision screen Hearing Screening - Comments:: Has trouble hearing Vision Screening - Comments:: Wears  eyeglasses/ Dr. Juvenal   Goals Addressed   None    Depression Screen     03/31/2024    3:19 PM 12/28/2022    9:42 AM 06/30/2022    2:25 PM 04/27/2022   10:09 AM 01/15/2022    9:48 AM 06/30/2021    3:19 PM  PHQ 2/9 Scores  PHQ - 2 Score 0 0 0 0 0 0  PHQ- 9 Score 0  0 13 0     Fall Risk     03/30/2024    8:44 AM 11/04/2023   10:13 AM 12/28/2022    9:42 AM 06/30/2022    2:25 PM 05/05/2022    1:56 PM  Fall Risk   Falls in the past year? 0 0 0  1  Number falls in past yr:  0 0 1 0  Injury with Fall? 0 0 0 0 0  Follow up Falls evaluation completed;Falls prevention discussed Falls evaluation completed Falls evaluation completed Falls evaluation completed  Falls evaluation completed      Data saved with a previous flowsheet row definition    MEDICARE RISK AT HOME:  Medicare Risk at Home Any stairs in or around the home?: (Patient-Rptd) Yes If so, are there any without handrails?: (Patient-Rptd) No Adequate lighting in your home to reduce risk of falls?: (Patient-Rptd) Yes Use of a cane, walker or w/c?: (Patient-Rptd) Yes Grab bars in the bathroom?: (Patient-Rptd) Yes Shower chair or bench in shower?: (Patient-Rptd) Yes Elevated toilet seat or a handicapped toilet?: (Patient-Rptd) Yes  TIMED UP AND GO:  Was the test performed?  Yes  Length of time to ambulate 10 feet: 30 sec Gait slow and steady with assistive device  Cognitive Function: Declined/Normal: No cognitive concerns noted by patient or family. Patient alert, oriented, able to answer questions appropriately and recall recent events. No signs of memory loss or confusion.        Immunizations Immunization History  Administered Date(s) Administered   Fluad Quad(high Dose 65+) 04/30/2021, 04/27/2022   Moderna Sars-Covid-2 Vaccination 10/19/2019, 11/17/2019, 06/30/2020    Screening Tests Health Maintenance  Topic Date Due   DTaP/Tdap/Td (1 - Tdap) Never done   Pneumococcal  Vaccine: 50+ Years (1 of 2 - PCV) Never done    Zoster Vaccines- Shingrix (1 of 2) Never done   Influenza Vaccine  02/25/2024   COVID-19 Vaccine (4 - 2025-26 season) 03/27/2024   HEMOGLOBIN A1C  05/05/2024   OPHTHALMOLOGY EXAM  11/21/2024   FOOT EXAM  02/14/2025   Medicare Annual Wellness (AWV)  03/31/2025   DEXA SCAN  Completed   HPV VACCINES  Aged Out   Meningococcal B Vaccine  Aged Out    Health Maintenance Items Addressed: See Nurse Notes at the end of this note  Additional Screening:  Vision Screening: Recommended annual ophthalmology exams for early detection of glaucoma and other disorders of the eye. Is the patient up to date with their annual eye exam?  No  Who is the provider or what is the name of the office in which the patient attends annual eye exams? Dr. Fleeta  Dental Screening: Recommended annual dental exams for proper oral hygiene  Community Resource Referral / Chronic Care Management: CRR required this visit?  No   CCM required this visit?  No   Plan:    I have personally reviewed and noted the following in the patient's chart:   Medical and social history Use of alcohol, tobacco or illicit drugs  Current medications and supplements including opioid prescriptions. Patient is not currently taking opioid prescriptions. Functional ability and status Nutritional status Physical activity Advanced directives List of other physicians Hospitalizations, surgeries, and ER visits in previous 12 months Vitals Screenings to include cognitive, depression, and falls Referrals and appointments  In addition, I have reviewed and discussed with patient certain preventive protocols, quality metrics, and best practice recommendations. A written personalized care plan for preventive services as well as general preventive health recommendations were provided to patient.   Hawley Pavia L Jakaiya Netherland, CMA   03/31/2024   After Visit Summary: (MyChart) Due to this being a telephonic visit, the after visit summary with patients  personalized plan was offered to patient via MyChart   Notes: Patient is due for a Flu and a pneumonia vaccine, which she would like to get during her next office visit here. She has some concerns about diarrhea, as she stated that it is effecting her mobility.  Patient would like to discuss with Dr. Rollene during her next office visit. Patient is requesting a order for a new transport wheelchair and also a Bp monitor for home use.  She stated that the one she has is very old and does not work accurately. Patient would also like to get labs done during her next office visit.

## 2024-04-03 ENCOUNTER — Ambulatory Visit: Attending: Internal Medicine | Admitting: Internal Medicine

## 2024-04-03 VITALS — BP 132/70 | HR 76 | Ht 60.0 in | Wt 119.8 lb

## 2024-04-03 DIAGNOSIS — I10 Essential (primary) hypertension: Secondary | ICD-10-CM | POA: Diagnosis not present

## 2024-04-03 DIAGNOSIS — E785 Hyperlipidemia, unspecified: Secondary | ICD-10-CM

## 2024-04-03 DIAGNOSIS — Z951 Presence of aortocoronary bypass graft: Secondary | ICD-10-CM

## 2024-04-03 DIAGNOSIS — E1169 Type 2 diabetes mellitus with other specified complication: Secondary | ICD-10-CM

## 2024-04-03 DIAGNOSIS — I2510 Atherosclerotic heart disease of native coronary artery without angina pectoris: Secondary | ICD-10-CM

## 2024-04-03 DIAGNOSIS — E118 Type 2 diabetes mellitus with unspecified complications: Secondary | ICD-10-CM | POA: Diagnosis not present

## 2024-04-03 DIAGNOSIS — Z79899 Other long term (current) drug therapy: Secondary | ICD-10-CM

## 2024-04-03 DIAGNOSIS — I6529 Occlusion and stenosis of unspecified carotid artery: Secondary | ICD-10-CM | POA: Diagnosis not present

## 2024-04-03 MED ORDER — AMLODIPINE BESYLATE 5 MG PO TABS: 2.5000 mg | ORAL_TABLET | ORAL | Status: DC

## 2024-04-03 NOTE — Progress Notes (Signed)
 " Cardiology Office Note:  .   Date:  04/03/2024  ID:  Pamela Barnett, DOB 1933-07-15, MRN [id number] PCP: Rollene Almarie LABOR, MD  [facility] Providers Cardiologist:  Soyla LABOR Merck, MD    History of Present Illness: .   Pamela Barnett is a 89 y.o. female.  Discussed the use of AI scribe software for clinical note transcription with the patient, who gave verbal consent to proceed.  History of Present Illness Pamela Barnett is an 89 year old female with hypertension and coronary artery disease who presents with concerns of low blood pressure after medication use.  She experiences elevated blood pressure in the morning, with readings reaching 175-180 mmHg and occasionally up to 200 mmHg. By 10 AM, her blood pressure typically decreases to around 130/70 mmHg without medication. Her current medication regimen includes isosorbide  30 mg daily in the morning, which further lowers her blood pressure, sometimes causing it to drop into the 90s systolic. She also takes amlodipine  5 mg in the evening, from the script appears it is every other day, which can cause her blood pressure to drop to 120 mmHg. Additionally, she is on irbesartan  300 mg daily and Lasix  20 mg as needed, which she sometimes halves to 10 mg.  She experiences symptoms of hypotension such as feeling poorly and sometimes feels 'jittery' or 'low' after taking amlodipine .  No chest pain, tightness, or discomfort, and she denies any angina or exertional dyspnea.    ROS: negative except per HPI above.  Studies Reviewed: .        Results LABS LDL: 66 Triglycerides: 97 J8r: 8% (10/2023)  RADIOLOGY Carotid ultrasound: Mild plaque, unchanged (08/2023) Risk Assessment/Calculations:       Physical Exam:   VS:  BP 132/70   Pulse 76   Ht 5' (1.524 m)   Wt 119 lb 12.8 oz (54.3 kg)   SpO2 98%   BMI 23.40 kg/m    Wt Readings from Last 3 Encounters:  04/03/24 119 lb 12.8 oz (54.3 kg)  03/31/24  121 lb 3.2 oz (55 kg)  11/04/23 117 lb (53.1 kg)     Physical Exam GENERAL: Alert, cooperative, well developed, no acute distress HEENT: Normocephalic, normal oropharynx, moist mucous membranes CHEST: Clear to auscultation bilaterally, no wheezes, rhonchi, or crackles CARDIOVASCULAR: Normal heart rate and rhythm, S1 and S2 normal without murmurs ABDOMEN: Soft, non-tender, non-distended, without organomegaly, normal bowel sounds EXTREMITIES: No cyanosis or edema NEUROLOGICAL: Cranial nerves grossly intact, moves all extremities without gross motor or sensory deficit   ASSESSMENT AND PLAN: .    Assessment and Plan Assessment & Plan Hypertension with medication-induced hypotension Hypertension with morning elevations and medication-induced hypotension. Isosorbide  mononitrate and amlodipine  contribute to symptomatic hypotension. Target blood pressure is 140/90 to prevent hypotension and falls. - Reduce isosorbide  mononitrate to 15 mg in the morning for two weeks. - Monitor and document blood pressure daily, focusing on 10 AM readings. - If stable after two weeks, reduce amlodipine  to 2.5 mg in the evening. - Submit blood pressure log after one month for review. - Consider medication discontinuation if blood pressure remains stable.  Coronary artery disease status post CABG Coronary artery disease post-CABG with no angina or exertional dyspnea. Cardiovascular status stable. - No cardiac travel restrictions; advised to ensure mobility support during travel.  Hyperlipidemia Hyperlipidemia controlled with atorvastatin . LDL at 66 mg/dL and triglycerides at 97 mg/dL indicate effective management.  Carotid artery atherosclerosis with mild plaque Mild carotid artery plaque with no significant changes  since last evaluation.      Soyla Merck, MD, [facility] "

## 2024-04-03 NOTE — Patient Instructions (Signed)
 Medication Instructions:  Your physician has recommended you make the following change in your medication:   DECREASE Imdur  to 15 mg daily in the morning for 2 weeks then DECREASE amlodipine  to 2.5 mg daily in the evening.  Check blood pressure daily, then send an update with blood pressure readings in 1 month.  *If you need a refill on your cardiac medications before your next appointment, please call your pharmacy*  Follow-Up: At [facility], you and your health needs are our priority.  As part of our continuing mission to provide you with exceptional heart care, our providers are all part of one team.  This team includes your primary Cardiologist (physician) and Advanced Practice Providers or APPs (Physician Assistants and Nurse Practitioners) who all work together to provide you with the care you need, when you need it.  Your next appointment:   8-12 week(s)  Provider:   Soyla DELENA Merck, MD   We recommend signing up for the patient portal called MyChart.  Sign up information is provided on this After Visit Summary.  MyChart is used to connect with patients for Virtual Visits (Telemedicine).  Patients are able to view lab/test results, encounter notes, upcoming appointments, etc.  Non-urgent messages can be sent to your provider as well.    To learn more about what you can do with MyChart, go to [email or web address].

## 2024-04-21 ENCOUNTER — Other Ambulatory Visit: Payer: Self-pay | Admitting: Cardiology

## 2024-04-21 ENCOUNTER — Other Ambulatory Visit: Payer: Self-pay | Admitting: Internal Medicine

## 2024-04-25 ENCOUNTER — Other Ambulatory Visit: Payer: Self-pay | Admitting: Internal Medicine

## 2024-05-10 ENCOUNTER — Ambulatory Visit: Payer: Self-pay | Admitting: Internal Medicine

## 2024-05-10 ENCOUNTER — Ambulatory Visit (INDEPENDENT_AMBULATORY_CARE_PROVIDER_SITE_OTHER): Admitting: Internal Medicine

## 2024-05-10 ENCOUNTER — Encounter: Payer: Self-pay | Admitting: Internal Medicine

## 2024-05-10 VITALS — BP 114/82 | HR 57 | Temp 97.7°F | Ht 60.0 in | Wt 120.0 lb

## 2024-05-10 DIAGNOSIS — M25562 Pain in left knee: Secondary | ICD-10-CM | POA: Diagnosis not present

## 2024-05-10 DIAGNOSIS — G8929 Other chronic pain: Secondary | ICD-10-CM

## 2024-05-10 DIAGNOSIS — M25561 Pain in right knee: Secondary | ICD-10-CM | POA: Diagnosis not present

## 2024-05-10 DIAGNOSIS — E118 Type 2 diabetes mellitus with unspecified complications: Secondary | ICD-10-CM | POA: Diagnosis not present

## 2024-05-10 LAB — COMPREHENSIVE METABOLIC PANEL WITH GFR
ALT: 14 U/L (ref 0–35)
AST: 16 U/L (ref 0–37)
Albumin: 4 g/dL (ref 3.5–5.2)
Alkaline Phosphatase: 58 U/L (ref 39–117)
BUN: 18 mg/dL (ref 6–23)
CO2: 30 meq/L (ref 19–32)
Calcium: 9.4 mg/dL (ref 8.4–10.5)
Chloride: 102 meq/L (ref 96–112)
Creatinine, Ser: 0.89 mg/dL (ref 0.40–1.20)
GFR: 57.45 mL/min — ABNORMAL LOW (ref >60.00)
Glucose, Bld: 123 mg/dL — ABNORMAL HIGH (ref 70–99)
Potassium: 4.2 meq/L (ref 3.5–5.1)
Sodium: 138 meq/L (ref 135–145)
Total Bilirubin: 0.4 mg/dL (ref 0.2–1.2)
Total Protein: 6.6 g/dL (ref 6.0–8.3)

## 2024-05-10 LAB — POCT GLYCOSYLATED HEMOGLOBIN (HGB A1C): HbA1c POC (<> result, manual entry): 7.2 % (ref 4.0–5.6)

## 2024-05-10 LAB — MICROALBUMIN / CREATININE URINE RATIO
Creatinine,U: 103.1 mg/dL
Microalb Creat Ratio: 111.4 mg/g — ABNORMAL HIGH (ref 0.0–30.0)
Microalb, Ur: 11.5 mg/dL — ABNORMAL HIGH (ref 0.0–1.9)

## 2024-05-10 MED ORDER — LANCET DEVICE MISC: 1.0000 | Freq: Every day | 11 refills | Status: AC

## 2024-05-10 NOTE — Patient Instructions (Addendum)
 We will check the labst oday. The HgA1c is 7.2 today.

## 2024-05-10 NOTE — Assessment & Plan Note (Signed)
 Hemoglobin A1c has improved to 7.2. Mild proteinuria is present with a microalbumin creatinine ratio of 200. GFR is at 61, indicating stable kidney function. Current management with irbesartan  is sufficient. Continue irbesartan  at the current dose, repeat the microalbumin creatinine ratio, and monitor renal function regularly.

## 2024-05-10 NOTE — Progress Notes (Signed)
 Subjective:   Patient ID: Pamela Barnett, female    DOB: [birth year], 89 y.o.   MRN: [id number]  Discussed the use of AI scribe software for clinical note transcription with the patient, who gave verbal consent to proceed.  History of Present Illness Pamela Barnett is an 89 year old female with hypertension and diabetes who presents for routine follow-up.  Her blood pressure readings have been stable recently, with a previous high of 203 mmHg. She is currently managing her hypertension with medication.  Her diabetes management has improved, with a recent hemoglobin A1c of 7.2%, down from previous readings around 8%. She is on metformin , which is associated with diarrhea, a symptom she is currently experiencing. She reports no new abdominal pain or hematochezia.  She denies any new chest pain, tightness, or pressure. She has a nighttime cough but no nasal drainage or heartburn. She experiences increased exertional dyspnea, attributing it to her age and existing health issues. She recalls having edema last year, which improved after starting a diuretic.  She reports arthralgia and myalgia, particularly in her knees and back, with no significant change in severity. Despite discomfort, she continues to engage in some physical activity.  Her vision remains stable with no new issues. She denies recent headaches or significant memory changes, though she acknowledges minor forgetfulness. Her hearing has worsened slightly, but she is not interested in using a hearing aid. She denies any new dermatological changes.  Recent lab work indicated a high microalbumin creatinine ratio. She is on irbesartan  for this condition, and her renal function remains stable with a GFR of 61.  Review of Systems  Constitutional: Negative.   HENT: Negative.    Eyes: Negative.   Respiratory:  Negative for cough, chest tightness and shortness of breath.   Cardiovascular:  Negative for chest pain, palpitations  and leg swelling.  Gastrointestinal:  Negative for abdominal distention, abdominal pain, constipation, diarrhea, nausea and vomiting.  Musculoskeletal: Negative.   Skin: Negative.   Neurological: Negative.   Psychiatric/Behavioral: Negative.      Objective:  Physical Exam Constitutional:      Appearance: She is well-developed.  HENT:     Head: Normocephalic and atraumatic.  Cardiovascular:     Rate and Rhythm: Normal rate and regular rhythm.  Pulmonary:     Effort: Pulmonary effort is normal. No respiratory distress.     Breath sounds: Normal breath sounds. No wheezing or rales.  Abdominal:     General: Bowel sounds are normal. There is no distension.     Palpations: Abdomen is soft.     Tenderness: There is no abdominal tenderness.  Musculoskeletal:     Cervical back: Normal range of motion.  Skin:    General: Skin is warm and dry.  Neurological:     Mental Status: She is alert and oriented to person, place, and time.     Coordination: Coordination normal.     Vitals:   05/10/24 1045  BP: 114/82  Pulse: (!) 57  Temp: 97.7 F (36.5 C)  TempSrc: Oral  SpO2: 98%  Weight: 120 lb (54.4 kg)  Height: 5' (1.524 m)    Assessment and Plan Assessment & Plan Type 2 diabetes mellitus with complications Hemoglobin A1c has improved to 7.2. Mild proteinuria is present with a microalbumin creatinine ratio of 200. GFR is at 61, indicating stable kidney function. Current management with irbesartan  is sufficient. Continue irbesartan  at the current dose, repeat the microalbumin creatinine ratio, and monitor renal function regularly.  Chronic  joint and back pain   Pain persists without significant change, but she maintains activity with current management strategies. Needs new rx for manual wheelchair which is done today.

## 2024-05-10 NOTE — Assessment & Plan Note (Signed)
 Pain persists without significant change, but she maintains activity with current management strategies. Needs new rx for manual wheelchair which is done today.

## 2024-05-10 NOTE — Addendum Note (Signed)
 Addended by: ROSALVA LEX RAMAN on: 05/10/2024 11:20 AM   Modules accepted: Orders

## 2024-05-19 DIAGNOSIS — R2689 Other abnormalities of gait and mobility: Secondary | ICD-10-CM | POA: Diagnosis not present

## 2024-05-22 ENCOUNTER — Encounter: Payer: Self-pay | Admitting: Internal Medicine

## 2024-05-24 DIAGNOSIS — R2689 Other abnormalities of gait and mobility: Secondary | ICD-10-CM | POA: Diagnosis not present

## 2024-05-31 ENCOUNTER — Encounter: Payer: Self-pay | Admitting: Internal Medicine

## 2024-06-01 MED ORDER — AMLODIPINE BESYLATE 5 MG PO TABS: 5.0000 mg | ORAL_TABLET | Freq: Every day | ORAL | 1 refills | Status: AC

## 2024-06-01 NOTE — Telephone Encounter (Signed)
 Called and spoke to Daughter, Pamela Barnett. Pt is currently taking Amlodipine  5 mg one tablet every other day. She is also taking Imdur  15 mg, once in the am and another 15 mg in the afternoon (around 3pm) every day. She had tried the Imdur  15 mg once daily in the am but then eventually, after about one week, her BP began to climb.   Main complaint today was last few days my BP has been high.systolic between  190-210 and diastolic around 95. Also, Slight headache and tiredness.  After reviewing with Dr. Acharya, she would like to :  Start Amlodipine  5 mg once EVERY EVENING. IF she experiences elevated BP's and also has Symptoms (Headache, tiredness) then she will take an additional 2.5 mg. Also, pt will continue to take Imdur  15 mg in am and 15 mg in the afternoon.   Advised daughter that pt should avoid Salt/Sodium (bread, canned foods, processed foods). Also, she may have low BP's and symptoms, so she should monitor these and bring BP log/symptoms to F/U OV on 06/07/24.   RX for Amlodipine  sent in to preferred Pharmacy. Pamela Barnett (daughter) verbalized understanding of the above.

## 2024-06-07 ENCOUNTER — Ambulatory Visit: Admitting: Internal Medicine

## 2024-06-07 ENCOUNTER — Ambulatory Visit: Attending: Student in an Organized Health Care Education/Training Program | Admitting: Internal Medicine

## 2024-06-07 ENCOUNTER — Encounter: Payer: Self-pay | Admitting: Internal Medicine

## 2024-06-07 VITALS — BP 170/74 | HR 79 | Resp 16 | Ht 60.0 in | Wt 122.2 lb

## 2024-06-07 DIAGNOSIS — E1169 Type 2 diabetes mellitus with other specified complication: Secondary | ICD-10-CM | POA: Diagnosis not present

## 2024-06-07 DIAGNOSIS — I10 Essential (primary) hypertension: Secondary | ICD-10-CM

## 2024-06-07 DIAGNOSIS — Z951 Presence of aortocoronary bypass graft: Secondary | ICD-10-CM

## 2024-06-07 DIAGNOSIS — Z79899 Other long term (current) drug therapy: Secondary | ICD-10-CM

## 2024-06-07 DIAGNOSIS — I6529 Occlusion and stenosis of unspecified carotid artery: Secondary | ICD-10-CM

## 2024-06-07 DIAGNOSIS — E785 Hyperlipidemia, unspecified: Secondary | ICD-10-CM

## 2024-06-07 DIAGNOSIS — I2510 Atherosclerotic heart disease of native coronary artery without angina pectoris: Secondary | ICD-10-CM

## 2024-06-07 NOTE — Progress Notes (Signed)
 Cardiology Office Note:  .   Date:  06/07/2024  ID:  Pamela Barnett, DOB 10-17-1932, MRN [id number] PCP: Rollene Almarie LABOR, MD  [facility] Providers Cardiologist:  Soyla LABOR Merck, MD    History of Present Illness: .   Pamela Barnett is a 89 y.o. female.  Discussed the use of AI scribe software for clinical note transcription with the patient, who gave verbal consent to proceed.  History of Present Illness Pamela Barnett is an 89 year old female with hypertension who presents for blood pressure management. She is accompanied by her daughter.  She has experienced very high blood pressure recently, with slight improvement over the last few days. Episodes of low blood pressure occur after bathing or walking, which she manages by monitoring her blood pressure and adjusting her medication. Her medications include isosorbide  15 mg twice daily, amlodipine  5 mg daily, and irbesartan  300 mg daily. She experienced symptomatic hypotension once, resolved by consuming salty lemon. She maintains a detailed blood pressure log. No chest, shoulder, or back discomfort during routine activities, but she experiences headache, left shoulder, and mid-scapular back pain when her blood pressure is very high.    ROS: negative except per HPI above.  Studies Reviewed: SABRA   EKG Interpretation Date/Time:  Wednesday June 07 2024 08:32:10 EST Ventricular Rate:  78 PR Interval:  204 QRS Duration:  64 QT Interval:  392 QTC Calculation: 446 R Axis:   17  Text Interpretation: Sinus rhythm with Premature atrial complexes Nonspecific ST and T wave abnormality Confirmed by Merck Soyla ([id number]) on 06/07/2024 8:38:36 AM    Results DIAGNOSTIC EKG: Normal (06/07/2024) Risk Assessment/Calculations:       Physical Exam:   VS:  BP (!) 170/74 (BP Location: Right Arm, Patient Position: Sitting, Cuff Size: Normal)   Pulse 79   Resp 16   Ht 5' (1.524 m)   Wt 122 lb 3.2 oz (55.4  kg)   SpO2 97%   BMI 23.87 kg/m    Wt Readings from Last 3 Encounters:  06/07/24 122 lb 3.2 oz (55.4 kg)  05/10/24 120 lb (54.4 kg)  04/03/24 119 lb 12.8 oz (54.3 kg)     Physical Exam VITALS: BP- 170/74 GENERAL: Alert, cooperative, well developed, no acute distress. HEENT: Normocephalic, normal oropharynx, moist mucous membranes. CHEST: Clear to auscultation bilaterally, no wheezes, rhonchi, or crackles. CARDIOVASCULAR: Normal heart rate and rhythm, S1 and S2 normal with a faint systolic heart murmur ABDOMEN: Soft, non-tender, non-distended, without organomegaly, normal bowel sounds. EXTREMITIES: No cyanosis or edema. NEUROLOGICAL: Cranial nerves grossly intact, moves all extremities without gross motor or sensory deficit.   ASSESSMENT AND PLAN: .    Assessment and Plan Assessment & Plan Essential hypertension with episodic symptomatic hypotension and associated headache and musculoskeletal pain Medication management Blood pressure fluctuates with episodes of high and low readings. High readings include 170/74 and 138/77. Low readings post-bathing due to vasodilation. Hypotension symptoms include headache and left shoulder and mid scapular back pain, resolving with increased blood pressure. Current management includes isosorbide , amlodipine , and irbesartan . Preference for slightly higher blood pressure to avoid hypotension risks. - Continue isosorbide  15 mg twice daily, amlodipine  5 mg daily, and irbesartan  300 mg daily. - If morning blood pressure is 140/80 or less, consider skipping isosorbide  15 mg. - If blood pressure is greater than 190/100 or symptomatic, consider taking additional isosorbide  15 mg or amlodipine  2.5 mg. - Maintain a blood pressure log and bring it to appointments. - Follow up in six  months unless symptoms worsen.  Coronary artery disease status post CABG Coronary artery disease post-CABG with no angina or exertional dyspnea. Cardiovascular status stable. -  No angina with activity   Hyperlipidemia Hyperlipidemia controlled with atorvastatin  10 mg daily. LDL at 66 mg/dL and triglycerides at 97 mg/dL indicate effective management.   Carotid artery atherosclerosis with mild plaque Mild carotid artery plaque with no significant changes       Soyla Merck, MD, FACC

## 2024-06-07 NOTE — Patient Instructions (Addendum)
 Medication Instructions:  No Changes; please see specific instructions below  Follow-Up: At [facility], you and your health needs are our priority.  As part of our continuing mission to provide you with exceptional heart care, our providers are all part of one team.  This team includes your primary Cardiologist (physician) and Advanced Practice Providers or APPs (Physician Assistants and Nurse Practitioners) who all work together to provide you with the care you need, when you need it.  Your next appointment:   6 month(s) (We will mail a reminder letter around Feb 2026; please call for a May 2026 appointment)  Provider:   Soyla DELENA Merck, MD   Other Instructions IF BP 140/80 OR LESS , consider skipping Isosorbide  Mononitrate (Imdur ) 15 mg dose  IF BP is GREATER than 190/100 or if symptomatic (headache, tired, etc), consider taking an additional Isosorbide  Mononitrate (Imdur ) 15 mg or Amlodipine  (Norvasc ) 2.5 mg. [place] additional doses should be taken only one time daily as needed]

## 2024-07-12 ENCOUNTER — Other Ambulatory Visit: Payer: Self-pay | Admitting: Internal Medicine

## 2024-07-25 ENCOUNTER — Encounter (HOSPITAL_COMMUNITY): Payer: Self-pay

## 2024-09-07 ENCOUNTER — Ambulatory Visit (HOSPITAL_COMMUNITY)

## 2025-02-14 ENCOUNTER — Ambulatory Visit: Admitting: Podiatry
